# Patient Record
Sex: Female | Born: 1955 | Race: White | Hispanic: No | Marital: Married | Smoking: Former smoker
Health system: Southern US, Community
[De-identification: ages and names within clinical notes are randomized; demographics above are authoritative.]

## PROBLEM LIST (undated history)

## (undated) DIAGNOSIS — Z8489 Family history of other specified conditions: Secondary | ICD-10-CM

## (undated) DIAGNOSIS — B1920 Unspecified viral hepatitis C without hepatic coma: Secondary | ICD-10-CM

## (undated) DIAGNOSIS — Z923 Personal history of irradiation: Secondary | ICD-10-CM

## (undated) DIAGNOSIS — C50919 Malignant neoplasm of unspecified site of unspecified female breast: Secondary | ICD-10-CM

## (undated) DIAGNOSIS — R112 Nausea with vomiting, unspecified: Secondary | ICD-10-CM

## (undated) DIAGNOSIS — I10 Essential (primary) hypertension: Secondary | ICD-10-CM

## (undated) DIAGNOSIS — F988 Other specified behavioral and emotional disorders with onset usually occurring in childhood and adolescence: Secondary | ICD-10-CM

## (undated) DIAGNOSIS — Z9889 Other specified postprocedural states: Secondary | ICD-10-CM

## (undated) DIAGNOSIS — E785 Hyperlipidemia, unspecified: Secondary | ICD-10-CM

## (undated) DIAGNOSIS — C801 Malignant (primary) neoplasm, unspecified: Secondary | ICD-10-CM

## (undated) HISTORY — DX: Hyperlipidemia, unspecified: E78.5

## (undated) HISTORY — DX: Other specified behavioral and emotional disorders with onset usually occurring in childhood and adolescence: F98.8

## (undated) HISTORY — DX: Malignant (primary) neoplasm, unspecified: C80.1

## (undated) HISTORY — DX: Essential (primary) hypertension: I10

## (undated) HISTORY — PX: WISDOM TOOTH EXTRACTION: SHX21

## (undated) HISTORY — DX: Unspecified viral hepatitis C without hepatic coma: B19.20

## (undated) HISTORY — PX: MASTECTOMY: SHX3

## (undated) HISTORY — PX: BLADDER SUSPENSION: SHX72

---

## 1992-02-04 HISTORY — PX: LEEP: SHX91

## 1997-06-28 ENCOUNTER — Ambulatory Visit: Admission: RE | Admit: 1997-06-28 | Discharge: 1997-06-28 | Payer: Self-pay | Admitting: Otolaryngology

## 1998-01-17 ENCOUNTER — Ambulatory Visit (HOSPITAL_COMMUNITY): Admission: RE | Admit: 1998-01-17 | Discharge: 1998-01-17 | Payer: Self-pay | Admitting: Otolaryngology

## 1999-05-09 ENCOUNTER — Other Ambulatory Visit: Admission: RE | Admit: 1999-05-09 | Discharge: 1999-05-09 | Payer: Self-pay | Admitting: Obstetrics and Gynecology

## 1999-11-21 ENCOUNTER — Encounter: Payer: Self-pay | Admitting: Obstetrics and Gynecology

## 1999-11-21 ENCOUNTER — Encounter: Admission: RE | Admit: 1999-11-21 | Discharge: 1999-11-21 | Payer: Self-pay | Admitting: Obstetrics and Gynecology

## 2000-07-02 ENCOUNTER — Other Ambulatory Visit: Admission: RE | Admit: 2000-07-02 | Discharge: 2000-07-02 | Payer: Self-pay | Admitting: Obstetrics and Gynecology

## 2001-03-08 ENCOUNTER — Encounter: Payer: Self-pay | Admitting: Obstetrics and Gynecology

## 2001-03-08 ENCOUNTER — Encounter: Admission: RE | Admit: 2001-03-08 | Discharge: 2001-03-08 | Payer: Self-pay | Admitting: Obstetrics and Gynecology

## 2002-02-03 DIAGNOSIS — C50919 Malignant neoplasm of unspecified site of unspecified female breast: Secondary | ICD-10-CM

## 2002-02-03 DIAGNOSIS — C801 Malignant (primary) neoplasm, unspecified: Secondary | ICD-10-CM

## 2002-02-03 DIAGNOSIS — Z923 Personal history of irradiation: Secondary | ICD-10-CM

## 2002-02-03 HISTORY — PX: OVARIAN CYST REMOVAL: SHX89

## 2002-02-03 HISTORY — DX: Malignant neoplasm of unspecified site of unspecified female breast: C50.919

## 2002-02-03 HISTORY — PX: TUBAL LIGATION: SHX77

## 2002-02-03 HISTORY — PX: BREAST LUMPECTOMY: SHX2

## 2002-02-03 HISTORY — DX: Personal history of irradiation: Z92.3

## 2002-02-03 HISTORY — DX: Malignant (primary) neoplasm, unspecified: C80.1

## 2002-09-15 ENCOUNTER — Other Ambulatory Visit: Admission: RE | Admit: 2002-09-15 | Discharge: 2002-09-15 | Payer: Self-pay | Admitting: Obstetrics and Gynecology

## 2002-09-26 ENCOUNTER — Encounter (INDEPENDENT_AMBULATORY_CARE_PROVIDER_SITE_OTHER): Payer: Self-pay | Admitting: *Deleted

## 2002-09-26 ENCOUNTER — Encounter: Payer: Self-pay | Admitting: Obstetrics and Gynecology

## 2002-09-26 ENCOUNTER — Encounter: Admission: RE | Admit: 2002-09-26 | Discharge: 2002-09-26 | Payer: Self-pay | Admitting: Obstetrics and Gynecology

## 2002-10-11 ENCOUNTER — Ambulatory Visit (HOSPITAL_BASED_OUTPATIENT_CLINIC_OR_DEPARTMENT_OTHER): Admission: RE | Admit: 2002-10-11 | Discharge: 2002-10-11 | Payer: Self-pay | Admitting: General Surgery

## 2002-10-11 ENCOUNTER — Encounter: Payer: Self-pay | Admitting: General Surgery

## 2002-10-11 ENCOUNTER — Encounter: Admission: RE | Admit: 2002-10-11 | Discharge: 2002-10-11 | Payer: Self-pay | Admitting: General Surgery

## 2002-10-11 ENCOUNTER — Encounter (INDEPENDENT_AMBULATORY_CARE_PROVIDER_SITE_OTHER): Payer: Self-pay | Admitting: Specialist

## 2002-11-07 ENCOUNTER — Ambulatory Visit: Admission: RE | Admit: 2002-11-07 | Discharge: 2003-01-11 | Payer: Self-pay | Admitting: Radiation Oncology

## 2003-01-20 ENCOUNTER — Ambulatory Visit (HOSPITAL_COMMUNITY): Admission: RE | Admit: 2003-01-20 | Discharge: 2003-01-20 | Payer: Self-pay | Admitting: Obstetrics and Gynecology

## 2003-01-20 ENCOUNTER — Encounter (INDEPENDENT_AMBULATORY_CARE_PROVIDER_SITE_OTHER): Payer: Self-pay | Admitting: Specialist

## 2003-02-07 ENCOUNTER — Ambulatory Visit: Admission: RE | Admit: 2003-02-07 | Discharge: 2003-02-07 | Payer: Self-pay

## 2003-02-14 ENCOUNTER — Ambulatory Visit: Admission: RE | Admit: 2003-02-14 | Discharge: 2003-02-14 | Payer: Self-pay | Admitting: Radiation Oncology

## 2003-02-17 ENCOUNTER — Encounter (INDEPENDENT_AMBULATORY_CARE_PROVIDER_SITE_OTHER): Payer: Self-pay | Admitting: *Deleted

## 2003-02-17 ENCOUNTER — Ambulatory Visit (HOSPITAL_COMMUNITY): Admission: RE | Admit: 2003-02-17 | Discharge: 2003-02-17 | Payer: Self-pay | Admitting: Gastroenterology

## 2003-09-27 ENCOUNTER — Encounter: Admission: RE | Admit: 2003-09-27 | Discharge: 2003-09-27 | Payer: Self-pay | Admitting: Radiation Oncology

## 2003-11-16 ENCOUNTER — Other Ambulatory Visit: Admission: RE | Admit: 2003-11-16 | Discharge: 2003-11-16 | Payer: Self-pay | Admitting: Obstetrics and Gynecology

## 2004-11-07 ENCOUNTER — Encounter: Admission: RE | Admit: 2004-11-07 | Discharge: 2004-11-07 | Payer: Self-pay | Admitting: Obstetrics and Gynecology

## 2005-01-02 ENCOUNTER — Other Ambulatory Visit: Admission: RE | Admit: 2005-01-02 | Discharge: 2005-01-02 | Payer: Self-pay | Admitting: Obstetrics and Gynecology

## 2005-02-03 HISTORY — PX: AUGMENTATION MAMMAPLASTY: SUR837

## 2005-11-20 ENCOUNTER — Encounter: Admission: RE | Admit: 2005-11-20 | Discharge: 2005-11-20 | Payer: Self-pay | Admitting: Obstetrics and Gynecology

## 2006-11-26 ENCOUNTER — Encounter: Admission: RE | Admit: 2006-11-26 | Discharge: 2006-11-26 | Payer: Self-pay | Admitting: Obstetrics and Gynecology

## 2007-11-29 ENCOUNTER — Encounter: Admission: RE | Admit: 2007-11-29 | Discharge: 2007-11-29 | Payer: Self-pay | Admitting: Internal Medicine

## 2008-03-07 ENCOUNTER — Emergency Department (HOSPITAL_COMMUNITY): Admission: EM | Admit: 2008-03-07 | Discharge: 2008-03-07 | Payer: Self-pay | Admitting: Emergency Medicine

## 2008-09-19 ENCOUNTER — Other Ambulatory Visit: Admission: RE | Admit: 2008-09-19 | Discharge: 2008-09-19 | Payer: Self-pay | Admitting: Internal Medicine

## 2008-12-11 ENCOUNTER — Encounter: Admission: RE | Admit: 2008-12-11 | Discharge: 2008-12-11 | Payer: Self-pay | Admitting: Internal Medicine

## 2008-12-20 ENCOUNTER — Other Ambulatory Visit: Admission: RE | Admit: 2008-12-20 | Discharge: 2008-12-20 | Payer: Self-pay | Admitting: Internal Medicine

## 2009-05-07 LAB — HM COLONOSCOPY

## 2009-05-28 ENCOUNTER — Ambulatory Visit (HOSPITAL_COMMUNITY): Admission: RE | Admit: 2009-05-28 | Discharge: 2009-05-28 | Payer: Self-pay | Admitting: Obstetrics and Gynecology

## 2009-06-26 ENCOUNTER — Encounter: Payer: Self-pay | Admitting: Cardiology

## 2009-06-28 ENCOUNTER — Ambulatory Visit: Payer: Self-pay | Admitting: Cardiology

## 2009-06-28 DIAGNOSIS — I10 Essential (primary) hypertension: Secondary | ICD-10-CM

## 2009-07-03 ENCOUNTER — Ambulatory Visit (HOSPITAL_COMMUNITY): Admission: RE | Admit: 2009-07-03 | Discharge: 2009-07-03 | Payer: Self-pay | Admitting: Cardiology

## 2009-07-03 ENCOUNTER — Ambulatory Visit: Payer: Self-pay

## 2009-07-03 ENCOUNTER — Encounter: Payer: Self-pay | Admitting: Cardiology

## 2009-07-03 ENCOUNTER — Ambulatory Visit: Payer: Self-pay | Admitting: Cardiology

## 2010-02-24 ENCOUNTER — Encounter: Payer: Self-pay | Admitting: Obstetrics and Gynecology

## 2010-03-07 NOTE — Assessment & Plan Note (Signed)
Summary: np6/dx:fam hx of cva/htn/ekg changes/lg   Primary Provider:  Dr. Rachel Moulds  CC:  pt complains of high BP. Abnormal EKG.  History of Present Illness: 55 -year-old female with no prior cardiac history for evaluation of abnormal electrocardiogram and hypertension. She denies any history of dyspnea on exertion, orthopnea, PND, pedal edema, palpitations, syncope or chest pain. She has had surgical procedures recently. During those operations  she was noted to have elevated blood pressure on her normal antihypertensive regimen of toprol 50 mg po daily. She was seen by her primary care physician 2 days ago and Benicar 40 mg p.o. daily was added. She has also had some headaches. Her electrocardiogram was also felt to be abnormal and cardiology was therefore asked to further evaluate.  Current Medications (verified): 1)  Tylenol 325 Mg Tabs (Acetaminophen) .... As Needed 2)  Xanax 0.5 Mg Tabs (Alprazolam) .... As Needed 3)  Multivitamins   Tabs (Multiple Vitamin) .Marland Kitchen.. 1 Tab By Mouth Once Daily 4)  Fish Oil   Oil (Fish Oil) .Marland Kitchen.. 1 Tab By Mouth When Pt Rembers 5)  Metoprolol Succinate 50 Mg Xr24h-Tab (Metoprolol Succinate) .... Take One Tablet By Mouth Daily 6)  Benicar 40 Mg Tabs (Olmesartan Medoxomil) .Marland Kitchen.. 1 Tab By Mouth Once Daily 7)  Wellbutrin Xl 300 Mg Xr24h-Tab (Bupropion Hcl) .Marland Kitchen.. 1  Tab By Mouth Once Daily 8)  Probiotic  Caps (Probiotic Product) .Marland Kitchen.. 1 Tab By Mouth Once Daily  Past History:  Past Medical History: hypertension depression  Urinary incontinence, menorrhagia.  H/O breast cancer (s/p right lumpectomy and radiation)  Past Surgical History: Dilation and curettage, hysteroscopy, NovaSure endometrial  ablation, Solyx mid urethral sling, cystoscopy.  She had past surgery lumpectomy in 2004, breast augmentation in March 2008.   Family History: Reviewed history from 06/28/2009 and no changes required. Sister died of stomach cancer Father with CABG at age 78; died of  CVA at age 55 Mother died of brain tumor  Social History: Reviewed history from 06/28/2009 and no changes required. Former tobacco Occasional ETOH She is married  One son Family doctor is Dr. Rachel Moulds.      Review of Systems       Occasional headaches but no fevers or chills, productive cough, hemoptysis, dysphasia, odynophagia, melena, hematochezia, dysuria, hematuria, rash, seizure activity, orthopnea, PND, pedal edema, claudication. Remaining systems are negative.   Vital Signs:  Patient profile:   55 year old female Height:      62 inches Weight:      125 pounds BMI:     22.95 Pulse rate:   77 / minute Resp:     12 per minute BP sitting:   162 / 100  (left arm)  Vitals Entered By: Burnett Kanaris (Jun 28, 2009 3:09 PM)  Physical Exam  General:  Well-developed well-nourished in no acute distress.  Skin is warm and dry.  HEENT is normal. No papilledema Neck is supple. No thyromegaly.  Chest is clear to auscultation with normal expansion. Status post breast reconstruction surgery. Cardiovascular exam is regular rate and rhythm.  Abdominal exam nontender or distended. No masses palpated. Extremities show no edema. neuro grossly intact BP LUE 162/94 BP RUE 164/92    EKG  Procedure date:  06/26/2009  Findings:      Normal sinus rhythm at a rate of 63. Left axis deviation. No significant ST changes.  Impression & Recommendations:  Problem # 1:  HYPERTENSION, CONTROLLED (ICD-401.1) Her blood pressure is elevated but Benicar was added  only 2 days ago. She is not having headaches at present and there is no papilledema or examination. She would like a generic blood pressure medication. I will discontinue Benicar. Instead I will treat with lisinopril 40 mg p.o. daily and also add hydrochlorothiazide 12.5 mg p.o. daily. She will follow her blood pressure at home. If it remains elevated I will increase Toprol and potentially at Norvasc in the future. I will plan a  BMET in one week. If her blood pressure continues to be difficult to control we will begin workup for secondary causes including renal Dopplers. Note a TSH recently was normal. Her updated medication list for this problem includes:    Metoprolol Succinate 50 Mg Xr24h-tab (Metoprolol succinate) .Marland Kitchen... Take one tablet by mouth daily    Lisinopril 40 Mg Tabs (Lisinopril) ..... One tablet by mouth once daily    Hydrochlorothiazide 12.5 Mg Tabs (Hydrochlorothiazide) .Marland Kitchen... Take one tablet by mouth daily.  Orders: Echocardiogram (Echo)  Problem # 2:  ABNORMAL ELECTROCARDIOGRAM (ICD-794.31) I have reviewed her electrocardiogram and I do not think it is significantly abnormal. She is not having cardiac symptoms including no chest pain and no shortness of breath. No further workup at this time. Her updated medication list for this problem includes:    Metoprolol Succinate 50 Mg Xr24h-tab (Metoprolol succinate) .Marland Kitchen... Take one tablet by mouth daily    Lisinopril 40 Mg Tabs (Lisinopril) ..... One tablet by mouth once daily  Orders: Echocardiogram (Echo)  Patient Instructions: 1)  Your physician recommends that you schedule a follow-up appointment in: [place] 2)  Your physician recommends that you return for lab work in:ONE WEEK 3)  Your physician has recommended you make the following change in your medication: STOP BENICAR 4)  START LISINOPRIL 40MG ONCE DAILY 5)  START HCTZ 12.5MG ONCE DAILY 6)  Your physician has requested that you have an echocardiogram.  Echocardiography is a painless test that uses sound waves to create images of your heart. It provides your doctor with information about the size and shape of your heart and how well your heart's chambers and valves are working.  This procedure takes approximately one hour. There are no restrictions for this procedure. Prescriptions: HYDROCHLOROTHIAZIDE 12.5 MG TABS (HYDROCHLOROTHIAZIDE) Take one tablet by mouth daily.  #30 x 12   Entered by:    Fredia Beets, RN   Authorized by:   Colin Mulders, MD, [facility]   Signed by:   Fredia Beets, RN on 06/28/2009   Method used:   Electronically to        [id number] (retail)       [address]       [place], [place]  [address]       Ph: [phone] or [phone]       Fax: [phone]   RxID:   [id number] LISINOPRIL 40 MG TABS (LISINOPRIL) ONE TABLET by mouth once daily  #30 x 12   Entered by:   Fredia Beets, RN   Authorized by:   Colin Mulders, MD, [facility]   Signed by:   Fredia Beets, RN on 06/28/2009   Method used:   Electronically to        [id number] (retail)       [address]       [place], [place]  [address]       Ph: [phone] or  [phone]       Fax: [phone]   RxID:   [id number]

## 2010-03-07 NOTE — Progress Notes (Signed)
Summary: [facility] Adolescent Office Note  [facility] Adolescent Office Note   Imported By: Sallee Provencal 07/18/2009 16:32:30  _____________________________________________________________________  External Attachment:    Type:   Image     Comment:   External Document

## 2010-04-23 LAB — CBC
HCT: 40.8 % (ref 36.0–46.0)
Hemoglobin: 13.8 g/dL (ref 12.0–15.0)
MCHC: 33.9 g/dL (ref 30.0–36.0)
MCV: 91.3 fL (ref 78.0–100.0)
RBC: 4.47 MIL/uL (ref 3.87–5.11)

## 2010-04-23 LAB — COMPREHENSIVE METABOLIC PANEL
ALT: 44 U/L — ABNORMAL HIGH (ref 0–35)
CO2: 25 mEq/L (ref 19–32)
Calcium: 9.3 mg/dL (ref 8.4–10.5)
Creatinine, Ser: 0.71 mg/dL (ref 0.4–1.2)
GFR calc non Af Amer: >60 mL/min (ref >60)
Glucose, Bld: 97 mg/dL (ref 70–99)
Sodium: 136 mEq/L (ref 135–145)

## 2010-05-21 LAB — URINALYSIS, ROUTINE W REFLEX MICROSCOPIC
Glucose, UA: NEGATIVE mg/dL
Ketones, ur: NEGATIVE mg/dL
Specific Gravity, Urine: 1.006 (ref 1.005–1.030)
pH: 7 (ref 5.0–8.0)

## 2010-05-21 LAB — CBC
HCT: 42.4 % (ref 36.0–46.0)
Platelets: 300 10*3/uL (ref 150–400)
WBC: 5.5 10*3/uL (ref 4.0–10.5)

## 2010-05-21 LAB — POCT PREGNANCY, URINE: Preg Test, Ur: NEGATIVE

## 2010-05-21 LAB — DIFFERENTIAL
Eosinophils Relative: 0 % (ref 0–5)
Lymphocytes Relative: 14 % (ref 12–46)
Lymphs Abs: 0.8 10*3/uL (ref 0.7–4.0)
Neutro Abs: 4.4 10*3/uL (ref 1.7–7.7)

## 2010-05-21 LAB — BASIC METABOLIC PANEL
BUN: 10 mg/dL (ref 6–23)
GFR calc non Af Amer: >60 mL/min (ref >60)
Potassium: 4.5 mEq/L (ref 3.5–5.1)

## 2010-06-05 ENCOUNTER — Other Ambulatory Visit: Payer: Self-pay | Admitting: Obstetrics and Gynecology

## 2010-06-05 DIAGNOSIS — Z853 Personal history of malignant neoplasm of breast: Secondary | ICD-10-CM

## 2010-06-05 DIAGNOSIS — Z09 Encounter for follow-up examination after completed treatment for conditions other than malignant neoplasm: Secondary | ICD-10-CM

## 2010-06-21 NOTE — Op Note (Signed)
NAME:  [name], [name]                            ACCOUNT NO.:  [id number]   MEDICAL RECORD NO.:  [id number]                   PATIENT TYPE:  AMB   LOCATION:  ENDO                                 FACILITY:  [facility]   PHYSICIAN:  Nelwyn Salisbury, M.D.               DATE OF BIRTH:  October 18, 1955   DATE OF PROCEDURE:  02/17/2003  DATE OF DISCHARGE:                                 OPERATIVE REPORT   PROCEDURE PERFORMED:  Screening colonoscopy.   ENDOSCOPIST:  Nelwyn Salisbury, M.D.   INSTRUMENT USED:  Olympus video colonoscope.   INDICATIONS FOR PROCEDURE:  A 55 year old white female with a family history  of colon cancer in a  sister and personal history of breast cancer  undergoing a screening colonoscopy, rule out colonic polyps, masses, etc.   PREPROCEDURE PREPARATION:  Informed consent was procured from the patient.  The patient was fasted for 8 hours prior to the procedure and prepped with a  bottle of magnesium citrate and a gallon of GoLYTELY the night prior to the  procedure.   PREPROCEDURE PHYSICAL EXAMINATION:  VITAL SIGNS:  The patient with stable  vital signs.  NECK:  Supple.  CHEST:  Clear to auscultation.  CARDIAC:  S1, S2, regular.  ABDOMEN:  Soft with normal bowel sounds.   DESCRIPTION OF THE PROCEDURE:  The patient was placed in the left lateral  decubitus position, sedated with 70 mg of Demerol and 7 mg of Versed  intravenously.  Once the patient was adequately sedated and maintained on  low-flow oxygen and continuous cardiac monitoring, the Olympus video  colonoscope was advanced from the rectum to the cecum and the appendiceal  orifice and the ileocecal valve were clearly visualized and photographed.  No masses, polyps, erosions, ulcerations, or diverticula were seen.  Small  internal hemorrhoids were appreciated on retroflexion in the rectum.  The  patient tolerated the procedure well without complication.   IMPRESSION:  Essentially normal colonoscopy up to the  cecum except for small  internal hemorrhoids.   RECOMMENDATIONS:  1. Continue on high fiber diet with liberal food intake.  2. Repeat CRC screening in the next 5 years unless the patient develops any     abnormal symptoms in the interim.  3. Outpatient follow-up in the next 2 weeks if the patient is having any     residual symptoms.  Further recommendations will be made at that time.                                               Nelwyn Salisbury, M.D.    JNM/MEDQ  D:  02/17/2003  T:  02/18/2003  Job:  [id number]   cc:   [name] Dad. Magrinat, M.D.  [address]  [place]  [place] [address]  Fax: [place]. Matthew Saras, M.D.  [address], [place]  [place] [address]  Fax: [phone]

## 2010-06-21 NOTE — Op Note (Signed)
NAME:  [name], [name]                            ACCOUNT NO.:  [id number]   MEDICAL RECORD NO.:  [id number]                   PATIENT TYPE:  AMB   LOCATION:  SDC                                  FACILITY:  [facility]   PHYSICIAN:  Ralene Bathe. Matthew Saras, M.D.            DATE OF BIRTH:  [birth year]   DATE OF PROCEDURE:  01/20/2003  DATE OF DISCHARGE:                                 OPERATIVE REPORT   PREOPERATIVE DIAGNOSES:  1. Requests permanent sterilization and removal of intrauterine device.  2. Left lower quadrant pain.  3. History of left ovarian cyst.   POSTOPERATIVE DIAGNOSES:  1. Requests permanent sterilization and removal of intrauterine device.  2. Left lower quadrant pain.  3. History of left ovarian cyst.   PROCEDURES:  1. Diagnostic laparoscopy.  2. Laparoscopic tubal ligation by Filshie clip application.  3. Left ovarian cystotomy.  4. Removal of intrauterine device.   SURGEON:  Ralene Bathe. Matthew Saras, M.D.   ANESTHESIA:  General endotracheal.   COMPLICATIONS:  None.   DRAINS:  In-and-out Foley catheter.   ESTIMATED BLOOD LOSS:  Less than 5 mL.   PROCEDURE AND FINDINGS:  The patient taken to the operating room.  After an  adequate level of general endotracheal anesthesia was obtained with the  patient's legs in stirrups, the abdomen, perineum, and vagina were prepped  and draped in the usual manner for a laparoscopy.  The bladder was drained,  EUA carried out.  The uterus was midposition, normal size, mobile, adnexa  negative.  The IUD was removed.  The Hulka tenaculum was positioned.  Attention directed to the abdomen, where the subumbilical skin was  infiltrated with 0.5% Marcaine plain.  A small incision was made.  The  Veress needle was introduced without difficulty.  Its intra-abdominal  position was verified by pressure and water testing.  After a 2.5 L  pneumoperitoneum was then created, laparoscopic trocar introduced without  difficulty.  There was no evidence of  any bleeding or trauma.  Three  fingerbreadths above the symphysis in the midline, the skin was infiltrated  with 0.5% Marcaine plain and a 5 mm trocar was inserted under direct  visualization.  The patient then placed in Trendelenburg and the uterus  anteflexed, the pelvic findings as follows:   The anterior and posterior spaces were unremarkable.  The uterus itself was  normal size.  The right ovary and adnexal areas are normal.  The cul-de-sac  was free and clear.  The left ovary was elevated and noted to have a simple-  appearing smooth-walled cyst of approximately 3 cm.  No other cysts were  noted.  Marcaine 0.5% plain 3-4 mL were then dripped across the tube from  the cornu to the fimbriated end.  Filshie clip applicator was back-loaded.  Filshie clips applied at a right angle 2 cm from the cornu of the tube after  carefully identifying it, with excellent  application across the tube at a  right angle.  The exact same repeated on the opposite side.  This was  photographed with excellent application.  The left ovary was then elevated,  the surface of the cyst was coagulated with the bipolar, and the scissors  used to incise the cyst.  Straw-colored fluid was drained and sent for  cytology.  The cyst collapsed nicely.  The cyst wall was not stripped.  This  was hemostatic.  No other abnormalities were noted.  Prior to closure the  sponge, needle, and instrument counts were reported as correct x2.  Instruments were removed, gas allowed to escape, the defect closed with 4-0  Dexon subcuticular sutures and Dermabond.  She received IV Toradol and went  to recovery room in good condition.                                               Richard M. Matthew Saras, M.D.    RMH/MEDQ  D:  01/20/2003  T:  01/20/2003  Job:  [id number]

## 2010-06-21 NOTE — H&P (Signed)
NAME:  [name], [name]                            ACCOUNT NO.:  [id number]   MEDICAL RECORD NO.:  [id number]                   PATIENT TYPE:  AMB   LOCATION:  SDC                                  FACILITY:  [facility]   PHYSICIAN:  Ralene Bathe. Matthew Saras, M.D.            DATE OF BIRTH:  04-Apr-1955   DATE OF ADMISSION:  01/20/2003  DATE OF DISCHARGE:                                HISTORY & PHYSICAL   CHIEF COMPLAINT:  For tubal ligation, intrauterine device removal.   HISTORY OF PRESENT ILLNESS:  A 55 year old G1, P1. This patient had a Lenda Kelp  IUD inserted June 2004. Shortly thereafter on mammography, an early breast  cancer was detected. She had a biopsy September 2004 that showed ductal  carcinoma in situ and had a lumpectomy. She is planning RT at this time. Her  hematology/oncology doctor had recommended possible consideration for tubal  to eliminate any extra sources of hormone, in this case the progesterone in  the IUD. She now presents for removal of the IUD and tubal ligation. The  permanence of this procedure, failure rate of 2 to 3 per 1,000, other risks  relative to bleeding, infection, the possible need for open or additional  surgery all reviewed with her, which she understands and accepts. Also of  note, on her last ultrasound September 2004, she had a multiple small  functional appearing cyst on her left ovary 3.5 x 2.2, 2.0 x 1.2, 2.5 x 2.5,  and 1.9 x 1.5 cm. We discussed laparoscopic aspiration or cystectomy if  noted at the time of this surgery also.   ALLERGIES:  None.   PAST SURGICAL HISTORY:  LEEP 10 years ago.  Also a history of cryo in the  past for an abnormal pap.   OBSTETRIC HISTORY:  One vaginal delivery in 1990.   FAMILY HISTORY:  Significant for a sister with breast cancer. Father with  hypertension and heart disease.   LABORATORY DATA:  Last pap August 2004 was normal. CA-125 August 2004 was  13.5.   PHYSICAL EXAMINATION:  VITAL SIGNS:  Temperature 98.2,  blood pressure  120/68.  HEENT:  Unremarkable.  NECK:  Supple without mass.  LUNGS:  Clear.  CARDIOVASCULAR:  Regular rate and rhythm. Without murmur, rub, or gallop.  BREAST:  Without palpable masses.  ABDOMEN:  Soft, flat, nontender.  PELVIC:  Normal external genitalia. Vagina and cervix clear. Uterus normal  position, normal size. Adnexa negative. The IUD string was noted. No adnexal  masses or tenderness noted preoperative.  EXTREMITIES:  Examination unremarkable.  NEUROLOGIC:  Examination unremarkable.   IMPRESSION:  1. History of early breast cancer.  2. Presents now for tubal ligation and removal of intrauterine device,     possible left ovarian cystotomy.   Procedure and risks reviewed as above.  Richard M. Matthew Saras, M.D.    RMH/MEDQ  D:  01/19/2003  T:  01/19/2003  Job:  [id number]

## 2010-06-21 NOTE — Op Note (Signed)
   NAME:  [name], [name]                            ACCOUNT NO.:  [id number]   MEDICAL RECORD NO.:  [id number]                   PATIENT TYPE:  AMB   LOCATION:  [place]                                  FACILITY:  [facility]   PHYSICIAN:  Rudell Cobb. Young, M.D.                DATE OF BIRTH:  March 06, 1955   DATE OF PROCEDURE:  10/11/2002  DATE OF DISCHARGE:                                 OPERATIVE REPORT   PREOPERATIVE DIAGNOSIS:  Ductal carcinoma in situ of the right breast.   POSTOPERATIVE DIAGNOSIS:  Ductal carcinoma in situ of the right breast.   OPERATION/PROCEDURE:  Right partial mastectomy with needle localization and  specimen mammography.   SURGEON:  Rudell Cobb. Annamaria Boots, M.D.   ANESTHESIA:  General.   OPERATIVE PROCEDURE:  After suitable general anesthesia was induced, the  patient was placed in a supine position with the arms extended on the arm  board.  The right breast was prepped and draped in the usual fashion.   A curved incision centered at the 12 o'clock position of the right breast  and below the entering localization wire was then outlined.  The incision  was made and then a wide excision of the wire and surrounding tissue was  carried out.  Hemostasis was obtained with the cautery.  We infiltrated the  incision and the deeper tissue with 1% Xylocaine and 0.25% Marcaine.  The  incision was closed in a single layer with interrupted 4-0 Monocryl in a  subcuticular fashion and then Steri-Strips.  Dressing applied.   The specimen mammography confirmed the removal of the calcifications with  what appears to be a good margin.  She was then transferred to the recovery  room in satisfactory condition having tolerated the procedure well.                                                Rudell Cobb. Annamaria Boots, M.D.    PRY/MEDQ  D:  10/11/2002  T:  10/11/2002  Job:  [id number]

## 2010-07-01 ENCOUNTER — Other Ambulatory Visit: Payer: Self-pay | Admitting: Cardiology

## 2010-08-13 ENCOUNTER — Ambulatory Visit
Admission: RE | Admit: 2010-08-13 | Discharge: 2010-08-13 | Disposition: A | Discharge status: Home / Self Care | Payer: Commercial Managed Care - PPO | Source: Ambulatory Visit | Attending: Obstetrics and Gynecology | Admitting: Obstetrics and Gynecology

## 2010-08-13 DIAGNOSIS — Z853 Personal history of malignant neoplasm of breast: Secondary | ICD-10-CM

## 2010-10-15 ENCOUNTER — Ambulatory Visit: Admit: 2010-10-15 | Payer: Self-pay | Admitting: Obstetrics and Gynecology

## 2010-10-15 ENCOUNTER — Other Ambulatory Visit: Payer: Self-pay | Admitting: Obstetrics and Gynecology

## 2010-10-15 ENCOUNTER — Ambulatory Visit (HOSPITAL_BASED_OUTPATIENT_CLINIC_OR_DEPARTMENT_OTHER)
Admission: RE | Admit: 2010-10-15 | Discharge: 2010-10-15 | Disposition: A | Discharge status: Home / Self Care | Payer: Commercial Managed Care - PPO | Source: Ambulatory Visit | Attending: Obstetrics and Gynecology | Admitting: Obstetrics and Gynecology

## 2010-10-15 DIAGNOSIS — F411 Generalized anxiety disorder: Secondary | ICD-10-CM | POA: Insufficient documentation

## 2010-10-15 DIAGNOSIS — Z79899 Other long term (current) drug therapy: Secondary | ICD-10-CM | POA: Insufficient documentation

## 2010-10-15 DIAGNOSIS — Z01812 Encounter for preprocedural laboratory examination: Secondary | ICD-10-CM | POA: Insufficient documentation

## 2010-10-15 DIAGNOSIS — I10 Essential (primary) hypertension: Secondary | ICD-10-CM | POA: Insufficient documentation

## 2010-10-15 DIAGNOSIS — N949 Unspecified condition associated with female genital organs and menstrual cycle: Secondary | ICD-10-CM | POA: Insufficient documentation

## 2010-10-15 DIAGNOSIS — D279 Benign neoplasm of unspecified ovary: Secondary | ICD-10-CM | POA: Insufficient documentation

## 2010-10-15 DIAGNOSIS — N838 Other noninflammatory disorders of ovary, fallopian tube and broad ligament: Secondary | ICD-10-CM | POA: Insufficient documentation

## 2010-10-15 LAB — CBC
HCT: 33.6 % — ABNORMAL LOW (ref 36.0–46.0)
MCH: 32 pg (ref 26.0–34.0)
MCHC: 34.5 g/dL (ref 30.0–36.0)
MCV: 92.8 fL (ref 78.0–100.0)
Platelets: 252 10*3/uL (ref 150–400)
RDW: 12.1 % (ref 11.5–15.5)

## 2010-10-15 LAB — POCT PREGNANCY, URINE: Preg Test, Ur: NEGATIVE

## 2010-10-15 LAB — POCT I-STAT 4, (NA,K, GLUC, HGB,HCT)
Glucose, Bld: 81 mg/dL (ref 70–99)
Potassium: 4.1 mEq/L (ref 3.5–5.1)
Sodium: 138 mEq/L (ref 135–145)

## 2010-10-15 SURGERY — LAPAROSCOPY OPERATIVE
Anesthesia: General

## 2010-10-25 ENCOUNTER — Emergency Department (HOSPITAL_COMMUNITY): Payer: Commercial Managed Care - PPO

## 2010-10-25 ENCOUNTER — Emergency Department (HOSPITAL_COMMUNITY)
Admission: EM | Admit: 2010-10-25 | Discharge: 2010-10-25 | Disposition: A | Discharge status: Home / Self Care | Payer: Commercial Managed Care - PPO | Attending: Emergency Medicine | Admitting: Emergency Medicine

## 2010-10-25 DIAGNOSIS — R110 Nausea: Secondary | ICD-10-CM | POA: Insufficient documentation

## 2010-10-25 DIAGNOSIS — Z9889 Other specified postprocedural states: Secondary | ICD-10-CM | POA: Insufficient documentation

## 2010-10-25 DIAGNOSIS — Z853 Personal history of malignant neoplasm of breast: Secondary | ICD-10-CM | POA: Insufficient documentation

## 2010-10-25 DIAGNOSIS — I10 Essential (primary) hypertension: Secondary | ICD-10-CM | POA: Insufficient documentation

## 2010-10-25 DIAGNOSIS — R109 Unspecified abdominal pain: Secondary | ICD-10-CM | POA: Insufficient documentation

## 2010-10-25 LAB — BASIC METABOLIC PANEL
BUN: 12 mg/dL (ref 6–23)
Chloride: 98 mEq/L (ref 96–112)
Creatinine, Ser: 0.79 mg/dL (ref 0.50–1.10)
GFR calc Af Amer: >60 mL/min (ref >60)
GFR calc non Af Amer: >60 mL/min (ref >60)
Potassium: 3.9 mEq/L (ref 3.5–5.1)

## 2010-10-25 LAB — DIFFERENTIAL
Basophils Absolute: 0 10*3/uL (ref 0.0–0.1)
Eosinophils Absolute: 0.1 10*3/uL (ref 0.0–0.7)
Eosinophils Relative: 2 % (ref 0–5)
Monocytes Absolute: 0.5 10*3/uL (ref 0.1–1.0)

## 2010-10-25 LAB — CBC
MCHC: 34 g/dL (ref 30.0–36.0)
MCV: 92.8 fL (ref 78.0–100.0)
Platelets: 320 10*3/uL (ref 150–400)
RDW: 12.7 % (ref 11.5–15.5)
WBC: 5.8 10*3/uL (ref 4.0–10.5)

## 2010-10-25 LAB — URINALYSIS, ROUTINE W REFLEX MICROSCOPIC
Bilirubin Urine: NEGATIVE
Ketones, ur: NEGATIVE mg/dL
Leukocytes, UA: NEGATIVE
Nitrite: NEGATIVE
Urobilinogen, UA: 0.2 mg/dL (ref 0.0–1.0)
pH: 6 (ref 5.0–8.0)

## 2010-11-11 NOTE — H&P (Signed)
  [name], [name]                  ACCOUNT NO.:  [id number]  MEDICAL RECORD NO.:  [id number]  LOCATION:                                 FACILITY:  PHYSICIAN:  Ralene Bathe. [name], M.D.DATE OF BIRTH:  01-19-1956  DATE OF ADMISSION:  10/15/2010 DATE OF DISCHARGE:                             HISTORY & PHYSICAL   This should go to the [name].  DATE OF SURGERY:  10/15/2010.  CHIEF COMPLAINT:  Chronic left lower quadrant pain, recurrent left ovarian cyst.  HISTORY OF PRESENT ILLNESS:  55 year old G1, P1, prior tubal.  This patient in 2011 had a mid urethral sling and NovaSure endometrial ablation and since that time has developed recurrent left lower quadrant pain.  Ultrasound exams in the office has shown a functional-appearing 2- 4 cm cyst on various occasions.  She was scheduled at one point for laparoscopic LSO.  The pain got better, but has recurred recently requiring narcotics for pain relief.  She would prefer to be conservative and maintain her right ovary if otherwise normal.  The procedure of laparoscopic LSO including risks related to bleeding, infection, transfusion, and the possible need for open additional surgery discussed with her, which she understands and accepts.  PAST MEDICAL HISTORY:  None.  ALLERGIES:  None.  CURRENT MEDICATIONS:  Wellbutrin, Toprol, and Percocet p.r.n.  REVIEW OF SYSTEMS:  Significant for hypertension.  PAST SURGICAL HISTORY:  She had a lumpectomy in 2000, breast augmentation in 2008, and mid urethral sling with NovaSure endometrial ablation.  FAMILY HISTORY:  Otherwise unremarkable.  SOCIAL HISTORY:  Denies tobacco use.  One drink per day.  She is married.  Her family doctor is Dr. Rachel Moulds.  PHYSICAL EXAMINATION:  VITAL SIGNS:  Temperature 98.2 and blood pressure 152/96. HEENT:  Unremarkable. NECK:  Supple without masses. LUNGS:  Clear. CARDIOVASCULAR:  Regular rate and rhythm  without murmurs, rubs, or gallops. BREASTS:  Without masses. ABDOMEN:  Soft, flat, and nontender. PELVIC EXAM:  Normal external genitalia.  Vaginal discharge is clear. Last Pap 05/11, which was negative.  Uterus midposition, normal size, slightly tender on the left.  No definite mass. EXTREMITIES:  Unremarkable. NEUROLOGIC EXAM:  Unremarkable.  Last ultrasound 07/12, demonstrated two simple cysts on the left, 3.7 x 4.2 and 1.9 x 1.5.  No free fluid, right side negative.  IMPRESSION:  Chronic left lower quadrant pain, recurrent left ovarian cyst.  PLAN:  Laparoscopic LSO.  Procedure and risks reviewed as above.     [name] M. [name], M.D.     RMH/MEDQ  D:  10/10/2010  T:  10/10/2010  Job:  [id number]  Electronically Signed by Molli Posey M.D. on 11/11/2010 08:59:23 AM

## 2010-11-11 NOTE — Op Note (Signed)
Barnett, Kathryn                 ACCOUNT NO.:  [id number]  MEDICAL RECORD NO.:  [id number]  LOCATION:                                 FACILITY:  PHYSICIAN:  Ralene Bathe. Matthew Saras, M.D.    DATE OF BIRTH:  DATE OF PROCEDURE: DATE OF DISCHARGE:                              OPERATIVE REPORT   PREOPERATIVE DIAGNOSES: 1. Recurrent left ovarian cyst. 2. Chronic pelvic pain.  POSTOPERATIVE DIAGNOSES: 1. Recurrent left ovarian cyst. 2. Chronic pelvic pain.  PROCEDURE:  Diagnostic laparoscopy followed by minilaparotomy with left oophorectomy, lysis of adhesions.  SURGEON:  Ralene Bathe. Matthew Saras, M.D.  ANESTHESIA:  General endotracheal.  COMPLICATIONS:  None.  DRAINS:  Foley catheter.  BLOOD LOSS:  Less than 50 mL.  SPECIMENS REMOVED:  Left ovary to Pathology.  COMPLICATIONS:  None.  PROCEDURE AND FINDINGS:  The patient was taken to the operating room. After an adequate level of general endotracheal anesthesia was obtained with the patient's legs in stirrups, the abdomen, perineum and vagina were prepped and draped in usual manner for laparoscopy.  A Kahn cannula was positioned on the uterus which was mid positioned, normal size. This was done after the bladder was drained with an in-and-out Foley catheter.  Attention directed to the umbilicus.  The subumbilical area was infiltrated with 0.25% Marcaine plain.  A small incision was made and the Veress needle was introduced without difficulty.  Its intra- abdominal position was verified by pressure and water testing.  After a 2-1/2 liter pneumoperitoneum was then created, laparoscopic trocar and sleeve were then introduced without difficulty.  There was no evidence of any bleeding or trauma.  Two fingerbreadths above the symphysis of the midline, a 5-mm trocar was inserted under direct visualization. Pelvic findings as follows.  The uterus itself was normal size, mobile.  Posterior cul-de-sac free and clear.  She had had a  clipped tubal previously.  The right ovary appeared to be otherwise normal.  On the left side, the ovary was enlarged approximately 4 cm with a smooth-walled cyst; was mobile, but there were some epiploical adhesions into the area of the left tube and ovary from her prior tubal on that side.  I could not get the ovary to elevate completely due to the size of the ovary.  Decision made to proceed with minilaparotomy.  The scope was removed.  The upper gas allowed to escape the upper incision closed with a 4-0 Vicryl subcuticular suture.  A small Pfannenstiel minilaparotomy incision was made, carried down individually through fascia and peritoneum.  The uterine instruments were used to elevate the uterus into the incision. Babcock clamp was then used to grasp the left round ligament and the left ovary was elevated into the incision.  The __________ epiploical adhesions were lysed with sharp dissection.  Once the ovary was elevated, the left __________ ligament was clamped, divided and suture ligated in sequential manner with 0-Vicryl suture, removing the left ovary.  The operative site was irrigated and noted to be hemostatic. Prior to closure, sponge, needle, instrument counts reported as correct x2.  Peritoneum closed with a running 2-0 Vicryl suture.  Rectus muscle was closed with interrupted 2-0 Vicryl  sutures.  Fascia closed transversely with a 0 Vicryl suture.  The subcutaneous tissue was minimal and was hemostatic.  4-0 Monocryl subcuticular suture on the skin.  This was infiltrated with 0.25% Marcaine plain on the radialis. She tolerated this well, went to recovery room in good condition.  Foley catheter was positioned prior to taking her to PACU.     [name], M.D.     [facility]/MEDQ  D:  10/15/2010  T:  10/15/2010  Job:  [id number]  Electronically Signed by Molli Posey M.D. on 11/11/2010 08:58:10 AM

## 2012-01-14 ENCOUNTER — Other Ambulatory Visit: Payer: Self-pay | Admitting: Obstetrics and Gynecology

## 2012-01-14 DIAGNOSIS — Z1231 Encounter for screening mammogram for malignant neoplasm of breast: Secondary | ICD-10-CM

## 2012-01-14 DIAGNOSIS — Z853 Personal history of malignant neoplasm of breast: Secondary | ICD-10-CM

## 2012-02-19 ENCOUNTER — Ambulatory Visit
Admission: RE | Admit: 2012-02-19 | Discharge: 2012-02-19 | Disposition: A | Discharge status: Home / Self Care | Payer: Commercial Managed Care - PPO | Source: Ambulatory Visit | Attending: Obstetrics and Gynecology | Admitting: Obstetrics and Gynecology

## 2012-02-19 DIAGNOSIS — Z853 Personal history of malignant neoplasm of breast: Secondary | ICD-10-CM

## 2012-02-19 DIAGNOSIS — Z1231 Encounter for screening mammogram for malignant neoplasm of breast: Secondary | ICD-10-CM

## 2012-09-03 DIAGNOSIS — B1920 Unspecified viral hepatitis C without hepatic coma: Secondary | ICD-10-CM

## 2012-09-03 HISTORY — DX: Unspecified viral hepatitis C without hepatic coma: B19.20

## 2012-09-28 ENCOUNTER — Other Ambulatory Visit: Payer: Self-pay

## 2012-09-28 ENCOUNTER — Other Ambulatory Visit (HOSPITAL_COMMUNITY)
Admission: RE | Admit: 2012-09-28 | Discharge: 2012-09-28 | Disposition: A | Discharge status: Home / Self Care | Payer: 59 | Source: Ambulatory Visit | Attending: Internal Medicine | Admitting: Internal Medicine

## 2012-09-28 DIAGNOSIS — Z01419 Encounter for gynecological examination (general) (routine) without abnormal findings: Secondary | ICD-10-CM | POA: Insufficient documentation

## 2012-12-21 ENCOUNTER — Encounter: Payer: Self-pay | Admitting: Physician Assistant

## 2012-12-27 ENCOUNTER — Encounter: Payer: Self-pay | Admitting: Internal Medicine

## 2012-12-27 DIAGNOSIS — I10 Essential (primary) hypertension: Secondary | ICD-10-CM | POA: Insufficient documentation

## 2012-12-27 DIAGNOSIS — E785 Hyperlipidemia, unspecified: Secondary | ICD-10-CM | POA: Insufficient documentation

## 2012-12-27 DIAGNOSIS — Z853 Personal history of malignant neoplasm of breast: Secondary | ICD-10-CM | POA: Insufficient documentation

## 2012-12-29 ENCOUNTER — Encounter: Payer: Self-pay | Admitting: Physician Assistant

## 2012-12-29 ENCOUNTER — Ambulatory Visit: Payer: PRIVATE HEALTH INSURANCE | Admitting: Physician Assistant

## 2012-12-29 VITALS — BP 98/60 | HR 96 | Temp 98.1°F | Resp 16 | Ht 62.0 in | Wt 119.0 lb

## 2012-12-29 DIAGNOSIS — L089 Local infection of the skin and subcutaneous tissue, unspecified: Secondary | ICD-10-CM

## 2012-12-29 DIAGNOSIS — F988 Other specified behavioral and emotional disorders with onset usually occurring in childhood and adolescence: Secondary | ICD-10-CM

## 2012-12-29 MED ORDER — AMPHETAMINE-DEXTROAMPHETAMINE 10 MG PO TABS: 10.0000 mg | ORAL_TABLET | Freq: Two times a day (BID) | ORAL | Status: DC

## 2012-12-29 MED ORDER — SULFAMETHOXAZOLE-TRIMETHOPRIM 400-80 MG PO TABS: 1.0000 | ORAL_TABLET | Freq: Two times a day (BID) | ORAL | Status: AC

## 2012-12-29 MED ORDER — ALPRAZOLAM 0.5 MG PO TABS: 0.5000 mg | ORAL_TABLET | Freq: Two times a day (BID) | ORAL | Status: DC | PRN

## 2012-12-29 NOTE — Progress Notes (Signed)
HPI Patient presents for a one month follow up. Patient is here for Seb cyst that is now infected.   She also complains of trouble focusing at work and when she was younger in school. Son also diagnosed with it.   Past Medical History  Diagnosis Date  . Hyperlipidemia   . Hypertension   . Cancer 2004    Breast      Allergies no known allergies    Current Outpatient Prescriptions on File Prior to Visit  Medication Sig Dispense Refill  . ALPRAZolam (XANAX) 0.5 MG tablet Take 0.5 mg by mouth 2 (two) times daily as needed for anxiety.      Marland Kitchen buPROPion (WELLBUTRIN XL) 300 MG 24 hr tablet Take 300 mg by mouth daily.      Marland Kitchen lisinopril-hydrochlorothiazide (PRINZIDE,ZESTORETIC) 20-25 MG per tablet Take 1 tablet by mouth daily.       No current facility-administered medications on file prior to visit.    ROS: all negative expect above.   Physical: Filed Weights   12/29/12 1554  Weight: 119 lb (53.978 kg)   Filed Vitals:   12/29/12 1554  BP: 98/60  Pulse: 96  Temp: 98.1 F (36.7 C)  Resp: 16   General Appearance: Well nourished, in no apparent distress. Eyes: PERRLA, EOMs. Sinuses: No Frontal/maxillary tenderness ENT/Mouth: Ext aud canals clear, normal light reflex with TMs without erythema, bulging. Post pharynx without erythema, swelling, exudate.  Respiratory: CTAB Cardio: RRR, no murmurs, rubs or gallops. Peripheral pulses brisk and equal bilaterally, without edema. No aortic or femoral bruits. Abdomen: Flat, soft, with bowl sounds. Nontender, no guarding, rebound. Lymphatics: Non tender without lymphadenopathy.  Musculoskeletal: Full ROM all peripheral extremities, 5/5 strength, and normal gait. Skin: Area on left back erythematous, 2x3 inch area of erythema, fluctuant. Area cleaned and 11 blade used to back an insicion, area packed and patient tolerated well.  Neuro: Cranial nerves intact, reflexes equal bilaterally. Normal muscle tone, no cerebellar symptoms. Sensation  intact.  Pysch: Awake and oriented X 3, normal affect, Insight and Judgment appropriate.   Assessment and Plan:  Seb Cyst- infected bactrim 500 # 20 ADD- Adderall 20 BID #60 NR Anxiety refill Xanax  Follow up in one month

## 2013-01-04 ENCOUNTER — Encounter: Payer: Self-pay | Admitting: Physician Assistant

## 2013-01-25 ENCOUNTER — Other Ambulatory Visit: Payer: Self-pay | Admitting: Physician Assistant

## 2013-01-25 MED ORDER — AMPHETAMINE-DEXTROAMPHETAMINE 10 MG PO TABS: 10.0000 mg | ORAL_TABLET | Freq: Two times a day (BID) | ORAL | Status: DC

## 2013-02-08 ENCOUNTER — Other Ambulatory Visit: Payer: Self-pay | Admitting: Physician Assistant

## 2013-02-08 ENCOUNTER — Ambulatory Visit (INDEPENDENT_AMBULATORY_CARE_PROVIDER_SITE_OTHER): Payer: 59 | Admitting: Physician Assistant

## 2013-02-08 ENCOUNTER — Encounter: Payer: Self-pay | Admitting: Physician Assistant

## 2013-02-08 VITALS — BP 118/78 | HR 80 | Temp 98.1°F | Resp 16 | Ht 62.0 in | Wt 115.0 lb

## 2013-02-08 DIAGNOSIS — E785 Hyperlipidemia, unspecified: Secondary | ICD-10-CM

## 2013-02-08 DIAGNOSIS — R768 Other specified abnormal immunological findings in serum: Secondary | ICD-10-CM

## 2013-02-08 DIAGNOSIS — I10 Essential (primary) hypertension: Secondary | ICD-10-CM

## 2013-02-08 DIAGNOSIS — Z1159 Encounter for screening for other viral diseases: Secondary | ICD-10-CM

## 2013-02-08 DIAGNOSIS — E559 Vitamin D deficiency, unspecified: Secondary | ICD-10-CM

## 2013-02-08 DIAGNOSIS — Z118 Encounter for screening for other infectious and parasitic diseases: Secondary | ICD-10-CM

## 2013-02-08 DIAGNOSIS — Z79899 Other long term (current) drug therapy: Secondary | ICD-10-CM

## 2013-02-08 LAB — CBC WITH DIFFERENTIAL/PLATELET
BASOS ABS: 0 10*3/uL (ref 0.0–0.1)
Basophils Relative: 0 % (ref 0–1)
EOS PCT: 2 % (ref 0–5)
Eosinophils Absolute: 0.1 10*3/uL (ref 0.0–0.7)
HCT: 42 % (ref 36.0–46.0)
Hemoglobin: 14.8 g/dL (ref 12.0–15.0)
LYMPHS ABS: 1.1 10*3/uL (ref 0.7–4.0)
LYMPHS PCT: 18 % (ref 12–46)
MCH: 31.1 pg (ref 26.0–34.0)
MCHC: 35.2 g/dL (ref 30.0–36.0)
MCV: 88.2 fL (ref 78.0–100.0)
Monocytes Absolute: 0.6 10*3/uL (ref 0.1–1.0)
Monocytes Relative: 11 % (ref 3–12)
NEUTROS ABS: 4 10*3/uL (ref 1.7–7.7)
Neutrophils Relative %: 69 % (ref 43–77)
PLATELETS: 274 10*3/uL (ref 150–400)
RBC: 4.76 MIL/uL (ref 3.87–5.11)
RDW: 13.1 % (ref 11.5–15.5)
WBC: 5.8 10*3/uL (ref 4.0–10.5)

## 2013-02-08 LAB — HEPATIC FUNCTION PANEL
ALBUMIN: 5 g/dL (ref 3.5–5.2)
ALK PHOS: 107 U/L (ref 39–117)
ALT: 43 U/L — ABNORMAL HIGH (ref 0–35)
AST: 43 U/L — AB (ref 0–37)
BILIRUBIN DIRECT: 0.1 mg/dL (ref 0.0–0.3)
BILIRUBIN INDIRECT: 0.6 mg/dL (ref 0.0–0.9)
Total Bilirubin: 0.7 mg/dL (ref 0.3–1.2)
Total Protein: 7.7 g/dL (ref 6.0–8.3)

## 2013-02-08 LAB — BASIC METABOLIC PANEL WITH GFR
BUN: 15 mg/dL (ref 6–23)
CO2: 29 meq/L (ref 19–32)
CREATININE: 0.8 mg/dL (ref 0.50–1.10)
Calcium: 10.1 mg/dL (ref 8.4–10.5)
Chloride: 97 mEq/L (ref 96–112)
GFR, EST NON AFRICAN AMERICAN: 83 mL/min
GFR, Est African American: >89 mL/min
Glucose, Bld: 95 mg/dL (ref 70–99)
POTASSIUM: 4.8 meq/L (ref 3.5–5.3)
SODIUM: 134 meq/L — AB (ref 135–145)

## 2013-02-08 LAB — MAGNESIUM: MAGNESIUM: 2.3 mg/dL (ref 1.5–2.5)

## 2013-02-08 LAB — LIPID PANEL
Cholesterol: 260 mg/dL — ABNORMAL HIGH (ref 0–200)
HDL: 130 mg/dL (ref >39)
LDL CALC: 120 mg/dL — AB (ref 0–99)
Total CHOL/HDL Ratio: 2 Ratio
Triglycerides: 50 mg/dL (ref <150)
VLDL: 10 mg/dL (ref 0–40)

## 2013-02-08 MED ORDER — AMPHETAMINE-DEXTROAMPHETAMINE 15 MG PO TABS: 15.0000 mg | ORAL_TABLET | Freq: Two times a day (BID) | ORAL | Status: DC

## 2013-02-08 NOTE — Progress Notes (Signed)
HPI Patient presents for 3 month follow up with hypertension, hyperlipidemia, and vitamin D. Patient's blood pressure has been controlled at home, today their BP is BP: 118/78 mmHg  Patient denies chest pain, shortness of breath, dizziness.  Patient was here 12/29/2012 for infected seb cyst and ADD. She was given bactrim and put on Adderall 29m. She states that the cyst has healed and the Adderall has helped her focus. She has at times taken 1.5 at one time and she takes it twice a day which she feels works for her. She just got the adderall 01/25/13.  Patient's cholesterol is diet controlled. The cholesterol last visit was LDL 108 (119). At her physical her LFTs were elevated at 43 and 47, a hepatitis panel was + for genotype 1a Hep C. She has not been to the hepatitis clinic and will need a referral today. 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 States she has had hepatitis B series.  Patient is on Vitamin D supplement.   Current Medications:  Current Outpatient Prescriptions on File Prior to Visit  Medication Sig Dispense Refill  . ALPRAZolam (XANAX) 0.5 MG tablet Take 1 tablet (0.5 mg total) by mouth 2 (two) times daily as needed for anxiety.  60 tablet  1  . amphetamine-dextroamphetamine (ADDERALL) 10 MG tablet Take 1 tablet (10 mg total) by mouth 2 (two) times daily with a meal.  60 tablet  0  . buPROPion (WELLBUTRIN XL) 300 MG 24 hr tablet Take 300 mg by mouth daily.      .Marland Kitchenlisinopril-hydrochlorothiazide (PRINZIDE,ZESTORETIC) 20-25 MG per tablet Take 1 tablet by mouth daily.       No current facility-administered medications on file prior to visit.   Medical History:  Past Medical History  Diagnosis Date  . Hyperlipidemia   . Hypertension   . ADD (attention deficit disorder)   . Cancer 2004    Breast   . Hepatitis C 09/2012    Genotype 1a, sent to [facility]   Allergies: No  Known Allergies  ROS Constitutional: Denies fever, chills, headaches, insomnia, fatigue, night sweats Eyes: Denies redness, blurred vision, diplopia, discharge, itchy, watery eyes.  ENT: Denies congestion, post nasal drip, sore throat, earache, dental pain, Tinnitus, Vertigo, Sinus pain, snoring.  Cardio: Denies chest pain, palpitations, irregular heartbeat, dyspnea, diaphoresis, orthopnea, PND, claudication, edema Respiratory: denies cough, shortness of breath, wheezing.  Gastrointestinal: Denies dysphagia, heartburn, AB pain/ cramps, N/V, diarrhea, constipation, hematemesis, melena, hematochezia,  hemorrhoids Genitourinary: Denies dysuria, frequency, urgency, nocturia, hesitancy, discharge, hematuria, flank pain Musculoskeletal: Denies myalgia, stiffness, pain, swelling and strain/sprain. Skin: Denies pruritis, rash, changing in skin lesion Neuro: Denies Weakness, tremor, incoordination, spasms, pain Psychiatric: Denies confusion, memory loss, sensory loss Endocrine: Denies change in weight, skin, hair change, nocturia Diabetic Polys, Denies visual blurring, hyper /hypo glycemic episodes, and paresthesia, Heme/Lymph: Denies Excessive bleeding, bruising, enlarged lymph nodes  Family history- Review and unchanged Social history- Review and unchanged Physical Exam: Filed Vitals:   02/08/13 0943  BP: 118/78  Pulse: 80  Temp: 98.1 F (36.7 C)  Resp: 16   Filed Weights   02/08/13 0943  Weight: 115 lb (52.164 kg)   General Appearance: Well nourished, in no apparent distress. Eyes: PERRLA, EOMs, conjunctiva no swelling or erythema Sinuses: No Frontal/maxillary tenderness ENT/Mouth: Ext aud canals clear, TMs without erythema, bulging. No erythema, swelling, or exudate on post pharynx.  Tonsils not swollen or  erythematous. Hearing normal.  Neck: Supple, thyroid normal.  Respiratory: Respiratory effort normal, BS equal bilaterally without rales, rhonchi, wheezing or stridor.  Cardio: RRR  with no MRGs. Brisk peripheral pulses without edema.  Abdomen: Soft, + BS.  Non tender, no guarding, rebound, hernias, masses. Lymphatics: Non tender without lymphadenopathy.  Musculoskeletal: Full ROM, 5/5 strength, normal gait.  Skin: Warm, dry without rashes, lesions, ecchymosis.  Neuro: Cranial nerves intact. Normal muscle tone, no cerebellar symptoms. Sensation intact.  Psych: Awake and oriented X 3, normal affect, Insight and Judgment appropriate.   Assessment and Plan:  Hypertension: Continue medication, monitor blood pressure at home.  Continue DASH diet. Cholesterol: Continue diet and exercise. Check cholesterol.  Vitamin D Def- check level and continue medications.  Vaginal atrophy- suggest discussing with OBGYN since she has a history of breast cancer.  Hepatitis C- check LFTs today, avoid tylenol. Alcohol and refer to hepatitis clinic. ADD- written post dated Adderall 13m #60 NR  Continue diet and meds as discussed. Further disposition pending results of labs.  CVicie Mutters9:51 AM

## 2013-02-08 NOTE — Patient Instructions (Signed)
VAGINAL DRYNESS OVERVIEW  Vaginal dryness, also known as atrophic vaginitis, is a common condition in postmenopausal women. This condition is also common in women who have had both ovaries removed at the time of hysterectomy.   Some women have uncomfortable symptoms of vaginal dryness, such as pain with sex, burning vaginal discomfort or itching, or abnormal vaginal discharge, while others have no symptoms at all.  VAGINAL DRYNESS CAUSES   Estrogen helps to keep the vagina moist and to maintain thickness of the vaginal lining. Vaginal dryness occurs when the ovaries produce a decreased amount of estrogen. This can occur at certain times in a woman's life, and may be permanent or temporary. Times when less estrogen is made include: ?At the time of menopause. ?After surgical removal of the ovaries, chemotherapy, or radiation therapy of the pelvis for cancer. ?After having a baby, particularly in women who breastfeed. ?While using certain medications, such as danazol, medroxyprogesterone (brand names: Provera or DepoProvera), leuprolide (brand name: Lupron), or nafarelin. When these medications are stopped, estrogen production resumes.  Women who smoke cigarettes have been shown to have an increased risk of an earlier menopause transition as compared to non-smokers. Therefore, atrophic vaginitis symptoms may appear at a younger age in this population.  VAGINAL DRYNESS TREATMENT   There are three treatment options for women with vaginal dryness:  Vaginal lubricants and moisturizers - Vaginal lubricants and moisturizers can be purchased without a prescription. These products do not contain any hormones and have virtually no side effects. - Albolene is found in the facial cleanser section at [facility], [facility], or [employer]. It is a large jar with a blue top. This is the best lubricant for women because it is hypoallergenic. -Natural lubricants, such as olive, avocado or peanut oil, are easily available  products that may be used as a lubricant with sex.  -Vaginal moisturizes (eg, Replens, Moist Again, Vagisil, K-Y Silk-E, and Feminease) are formulated to allow water to be retained in the vaginal tissues. Moisturizers are applied into the vagina three times weekly to allow a continued moisturizing effect. These should not be used just before having sex, as they can be irritating.  Vaginal estrogen - Vaginal estrogen is the most effective treatment option for women with vaginal dryness. Vaginal estrogen must be prescribed by a healthcare provider. Very low doses of vaginal estrogen can be used when it is put into the vagina to treat vaginal dryness. A small amount of estrogen is absorbed into the bloodstream, but only about 100 times less than when using estrogen pills or tablets. As a result, there is a much lower risk of side effects, such as blood clots, breast cancer, and heart attack, compared with other estrogen-containing products (birth control pills, menopausal hormone therapy).   Ospemifene - Ospemifene is a prescription medication that is similar to estrogen, but is not estrogen. In the vaginal tissue, it acts similarly to estrogen. In the breast tissue, it acts as an estrogen blocker. It comes in a pill, and is prescribed for women who want to use an estrogen-like medication for vaginal dryness or painful sex associated with vaginal dryness, but prefer not to use a vaginal medication. The medication may cause hot flashes as a side effect. This type of medication may increase the risk of blood clots or uterine cancer. Further study of ospemifene is needed to evaluate the risk of these complications. This medication has not been tested in women who have had breast cancer or are at a high risk of developing  breast cancer.    Sexual activity - Vaginal estrogen improves vaginal dryness quickly, usually within a few weeks. You may continue to have sex as you treat vaginal dryness because sex itself  can help to keep the vaginal tissues healthy. Vaginal intercourse may help the vaginal tissues by keeping them soft and stretchable and preventing the tissues from shrinking.  If sex continues to be painful despite treatment for vaginal dryness, talk to your healthcare provider.

## 2013-02-09 LAB — RPR

## 2013-02-09 LAB — HIV ANTIBODY (ROUTINE TESTING W REFLEX): HIV: NONREACTIVE

## 2013-02-09 LAB — TSH: TSH: 1.853 u[IU]/mL (ref 0.350–4.500)

## 2013-02-09 LAB — VITAMIN D 25 HYDROXY (VIT D DEFICIENCY, FRACTURES): VIT D 25 HYDROXY: 92 ng/mL — AB (ref 30–89)

## 2013-02-22 ENCOUNTER — Other Ambulatory Visit: Payer: Self-pay

## 2013-02-22 DIAGNOSIS — B1920 Unspecified viral hepatitis C without hepatic coma: Secondary | ICD-10-CM

## 2013-02-22 DIAGNOSIS — R768 Other specified abnormal immunological findings in serum: Secondary | ICD-10-CM

## 2013-03-01 ENCOUNTER — Encounter: Payer: Self-pay | Admitting: Physician Assistant

## 2013-03-16 ENCOUNTER — Other Ambulatory Visit: Payer: Self-pay | Admitting: Physician Assistant

## 2013-03-16 ENCOUNTER — Encounter: Payer: Self-pay | Admitting: Physician Assistant

## 2013-03-16 MED ORDER — AMPHETAMINE-DEXTROAMPHETAMINE 15 MG PO TABS: 15.0000 mg | ORAL_TABLET | Freq: Two times a day (BID) | ORAL | Status: DC

## 2013-03-28 ENCOUNTER — Other Ambulatory Visit: Payer: Self-pay | Admitting: Physician Assistant

## 2013-04-22 ENCOUNTER — Other Ambulatory Visit: Payer: Self-pay | Admitting: Physician Assistant

## 2013-04-22 MED ORDER — AMPHETAMINE-DEXTROAMPHETAMINE 15 MG PO TABS: 15.0000 mg | ORAL_TABLET | Freq: Two times a day (BID) | ORAL | Status: DC

## 2013-05-16 ENCOUNTER — Other Ambulatory Visit: Payer: Self-pay | Admitting: Internal Medicine

## 2013-05-26 ENCOUNTER — Other Ambulatory Visit: Payer: Self-pay | Admitting: Emergency Medicine

## 2013-05-31 ENCOUNTER — Ambulatory Visit (INDEPENDENT_AMBULATORY_CARE_PROVIDER_SITE_OTHER): Payer: PRIVATE HEALTH INSURANCE | Admitting: Physician Assistant

## 2013-05-31 ENCOUNTER — Encounter: Payer: Self-pay | Admitting: Physician Assistant

## 2013-05-31 VITALS — BP 138/80 | HR 100 | Temp 98.1°F | Resp 16 | Wt 116.0 lb

## 2013-05-31 DIAGNOSIS — E559 Vitamin D deficiency, unspecified: Secondary | ICD-10-CM

## 2013-05-31 DIAGNOSIS — Z79899 Other long term (current) drug therapy: Secondary | ICD-10-CM

## 2013-05-31 DIAGNOSIS — E785 Hyperlipidemia, unspecified: Secondary | ICD-10-CM

## 2013-05-31 DIAGNOSIS — I10 Essential (primary) hypertension: Secondary | ICD-10-CM

## 2013-05-31 LAB — CBC WITH DIFFERENTIAL/PLATELET
BASOS PCT: 0 % (ref 0–1)
Basophils Absolute: 0 10*3/uL (ref 0.0–0.1)
Eosinophils Absolute: 0.1 10*3/uL (ref 0.0–0.7)
Eosinophils Relative: 1 % (ref 0–5)
HCT: 42 % (ref 36.0–46.0)
HEMOGLOBIN: 14.6 g/dL (ref 12.0–15.0)
LYMPHS PCT: 33 % (ref 12–46)
Lymphs Abs: 2.1 10*3/uL (ref 0.7–4.0)
MCH: 30.2 pg (ref 26.0–34.0)
MCHC: 34.8 g/dL (ref 30.0–36.0)
MCV: 86.8 fL (ref 78.0–100.0)
MONOS PCT: 8 % (ref 3–12)
Monocytes Absolute: 0.5 10*3/uL (ref 0.1–1.0)
NEUTROS ABS: 3.7 10*3/uL (ref 1.7–7.7)
Neutrophils Relative %: 58 % (ref 43–77)
Platelets: 321 10*3/uL (ref 150–400)
RBC: 4.84 MIL/uL (ref 3.87–5.11)
RDW: 12.8 % (ref 11.5–15.5)
WBC: 6.4 10*3/uL (ref 4.0–10.5)

## 2013-05-31 LAB — BASIC METABOLIC PANEL WITH GFR
BUN: 10 mg/dL (ref 6–23)
CO2: 30 mEq/L (ref 19–32)
Calcium: 10.2 mg/dL (ref 8.4–10.5)
Chloride: 96 mEq/L (ref 96–112)
Creat: 0.67 mg/dL (ref 0.50–1.10)
Glucose, Bld: 98 mg/dL (ref 70–99)
POTASSIUM: 4.1 meq/L (ref 3.5–5.3)
Sodium: 136 mEq/L (ref 135–145)

## 2013-05-31 LAB — LIPID PANEL
Cholesterol: 236 mg/dL — ABNORMAL HIGH (ref 0–200)
HDL: 131 mg/dL (ref >39)
LDL Cholesterol: 89 mg/dL (ref 0–99)
Total CHOL/HDL Ratio: 1.8 Ratio
Triglycerides: 80 mg/dL (ref <150)
VLDL: 16 mg/dL (ref 0–40)

## 2013-05-31 LAB — MAGNESIUM: Magnesium: 2.1 mg/dL (ref 1.5–2.5)

## 2013-05-31 LAB — TSH: TSH: 1.882 u[IU]/mL (ref 0.350–4.500)

## 2013-05-31 MED ORDER — BUPROPION HCL ER (XL) 300 MG PO TB24: 300.0000 mg | ORAL_TABLET | Freq: Every day | ORAL | Status: DC

## 2013-05-31 MED ORDER — AMPHETAMINE-DEXTROAMPHETAMINE 30 MG PO TABS: 30.0000 mg | ORAL_TABLET | Freq: Two times a day (BID) | ORAL | Status: DC

## 2013-05-31 NOTE — Progress Notes (Signed)
HPI 58 y.o. female  presents for 3 month follow up with hypertension, hyperlipidemia, prediabetes and vitamin D. Her blood pressure has been controlled at home, today their BP is BP: 138/80 mmHg She does workout. She denies chest pain, shortness of breath, dizziness.  She is not on cholesterol medication and denies myalgias. Her cholesterol is at goal. The cholesterol last visit was:   Lab Results  Component Value Date   CHOL 260* 02/08/2013   HDL 130 02/08/2013   LDLCALC 120* 02/08/2013   TRIG 50 02/08/2013   CHOLHDL 2.0 02/08/2013   She is being seen by Hepatitis clinic in [place] and has not qualified for treatment but she is trying to get that appealed.  Patient is on Vitamin D supplement.     Current Medications:  Current Outpatient Prescriptions on File Prior to Visit  Medication Sig Dispense Refill  . ALPRAZolam (XANAX) 0.5 MG tablet TAKE 1 TABLET TWICE A DAY AS NEEDED FOR ANXIETY  60 tablet  1  . amphetamine-dextroamphetamine (ADDERALL) 15 MG tablet Take 1 tablet (15 mg total) by mouth 2 (two) times daily with a meal.  60 tablet  0  . buPROPion (WELLBUTRIN XL) 300 MG 24 hr tablet TAKE 1 TABLET BY MOUTH EVERY DAY  30 tablet  0  . lisinopril-hydrochlorothiazide (PRINZIDE,ZESTORETIC) 20-25 MG per tablet Take 1 tablet by mouth daily.       No current facility-administered medications on file prior to visit.   Medical History:  Past Medical History  Diagnosis Date  . Hyperlipidemia   . Hypertension   . ADD (attention deficit disorder)   . Cancer 2004    Breast   . Hepatitis C 09/2012    Genotype 1a, sent to Hep Clinic   Allergies: No Known Allergies   Review of Systems: [X]  = complains of  [ ]  = denies  General: Fatigue [ ]  Fever [ ]  Chills [ ]  Weakness [ ]   Insomnia [ ]  Eyes: Redness [ ]  Blurred vision [ ]  Diplopia [ ]   ENT: Congestion [ ]  Sinus Pain [ ]  Post Nasal Drip [ ]  Sore Throat [ ]  Earache [ ]   Cardiac: Chest pain/pressure [ ]  SOB [ ]  Orthopnea [ ]   Palpitations [ ]    Paroxysmal nocturnal dyspnea[ ]  Claudication [ ]  Edema [ ]   Pulmonary: Cough [ ]  Wheezing[ ]   SOB [ ]   Snoring [ ]   GI: Nausea [ ]  Vomiting[ ]  Dysphagia[ ]  Heartburn[ ]  Abdominal pain [ ]  Constipation [ ] ; Diarrhea [ ] ; BRBPR [ ]  Melena[ ]  GU: Hematuria[ ]  Dysuria [ ]  Nocturia[ ]  Urgency [ ]   Hesitancy [ ]  Discharge [ ]  Neuro: Headaches[ ]  Vertigo[ ]  Paresthesias[ ]  Spasm [ ]  Speech changes [ ]  Incoordination [ ]   Ortho: Arthritis [ ]  Joint pain [ ]  Muscle pain [ ]  Joint swelling [ ]  Back Pain [ ]  Skin:  Rash [ ]   Pruritis [ ]  Change in skin lesion [ ]   Psych: Depression[ ]  Anxiety[ ]  Confusion [ ]  Memory loss [ ]   Heme/Lypmh: Bleeding [ ]  Bruising [ ]  Enlarged lymph nodes [ ]   Endocrine: Visual blurring [ ]  Paresthesia [ ]  Polyuria [ ]  Polydypsea [ ]    Heat/cold intolerance [ ]  Hypoglycemia [ ]   Family history- Review and unchanged Social history- Review and unchanged Physical Exam: BP 138/80  Pulse 100  Temp(Src) 98.1 F (36.7 C)  Resp 16  Wt 116 lb (52.617 kg) Wt Readings from Last 3 Encounters:  05/31/13 116  lb (52.617 kg)  02/08/13 115 lb (52.164 kg)  12/29/12 119 lb (53.978 kg)   General Appearance: Well nourished, in no apparent distress. Eyes: PERRLA, EOMs, conjunctiva no swelling or erythema Sinuses: No Frontal/maxillary tenderness ENT/Mouth: Ext aud canals clear, TMs without erythema, bulging. No erythema, swelling, or exudate on post pharynx.  Tonsils not swollen or erythematous. Hearing normal.  Neck: Supple, thyroid normal.  Respiratory: Respiratory effort normal, BS equal bilaterally without rales, rhonchi, wheezing or stridor.  Cardio: RRR with no MRGs. Brisk peripheral pulses without edema.  Abdomen: Soft, + BS.  Non tender, no guarding, rebound, hernias, masses. Lymphatics: Non tender without lymphadenopathy.  Musculoskeletal: Full ROM, 5/5 strength, normal gait.  Skin: Warm, dry without rashes, lesions, ecchymosis.  Neuro: Cranial nerves intact. Normal muscle  tone, no cerebellar symptoms. Sensation intact.  Psych: Awake and oriented X 3, normal affect, Insight and Judgment appropriate.   Assessment and Plan:  Hypertension: Continue medication, monitor blood pressure at home. Continue DASH diet. Cholesterol: Continue diet and exercise. Check cholesterol.  ADD medication- doing well on that, will increase to 72m for her to break in half and have prescription last longer.  Vitamin D Def- check level and continue medications.   Continue diet and meds as discussed. Further disposition pending results of labs.  AVicie Mutters3:11 PM

## 2013-05-31 NOTE — Patient Instructions (Signed)
Cholesterol Cholesterol is a white, waxy, fat-like protein needed by your body in small amounts. The liver makes all the cholesterol you need. It is carried from the liver by the blood through the blood vessels. Deposits (plaque) may build up on blood vessel walls. This makes the arteries narrower and stiffer. Plaque increases the risk for heart attack and stroke. You cannot feel your cholesterol level even if it is very high. The only way to know is by a blood test to check your lipid (fats) levels. Once you know your cholesterol levels, you should keep a record of the test results. Work with your caregiver to to keep your levels in the desired range. WHAT THE RESULTS MEAN:  Total cholesterol is a rough measure of all the cholesterol in your blood.  LDL is the so-called bad cholesterol. This is the type that deposits cholesterol in the walls of the arteries. You want this level to be low.  HDL is the good cholesterol because it cleans the arteries and carries the LDL away. You want this level to be high.  Triglycerides are fat that the body can either burn for energy or store. High levels are closely linked to heart disease. DESIRED LEVELS:  Total cholesterol below 200.  LDL below 100 for people at risk, below 70 for very high risk.  HDL above 50 is good, above 60 is best.  Triglycerides below 150. HOW TO LOWER YOUR CHOLESTEROL:  Diet.  Choose fish or white meat chicken and [place], roasted or baked. Limit fatty cuts of red meat, fried foods, and processed meats, such as sausage and lunch meat.  Eat lots of fresh fruits and vegetables. Choose whole grains, beans, pasta, potatoes and cereals.  Use only small amounts of olive, corn or canola oils. Avoid butter, mayonnaise, shortening or palm kernel oils. Avoid foods with trans-fats.  Use skim/nonfat milk and low-fat/nonfat yogurt and cheeses. Avoid whole milk, cream, ice cream, egg yolks and cheeses. Healthy desserts include angel food  cake, ginger snaps, animal crackers, hard candy, popsicles, and low-fat/nonfat frozen yogurt. Avoid pastries, cakes, pies and cookies.  Exercise.  A regular program helps decrease LDL and raises HDL.  Helps with weight control.  Do things that increase your activity level like gardening, walking, or taking the stairs.  Medication.  May be prescribed by your caregiver to help lowering cholesterol and the risk for heart disease.  You may need medicine even if your levels are normal if you have several risk factors. HOME CARE INSTRUCTIONS   Follow your diet and exercise programs as suggested by your caregiver.  Take medications as directed.  Have blood work done when your caregiver feels it is necessary. MAKE SURE YOU:   Understand these instructions.  Will watch your condition.  Will get help right away if you are not doing well or get worse. Document Released: 10/15/2000 Document Revised: 04/14/2011 Document Reviewed: 11/03/2012 [facility] Patient Information 2014 [place], [place].

## 2013-06-01 LAB — VITAMIN D 25 HYDROXY (VIT D DEFICIENCY, FRACTURES): VIT D 25 HYDROXY: 76 ng/mL (ref 30–89)

## 2013-07-27 ENCOUNTER — Encounter: Payer: Self-pay | Admitting: Physician Assistant

## 2013-07-28 MED ORDER — AMPHETAMINE-DEXTROAMPHETAMINE 30 MG PO TABS: 30.0000 mg | ORAL_TABLET | Freq: Two times a day (BID) | ORAL | Status: DC

## 2013-08-05 ENCOUNTER — Inpatient Hospital Stay (HOSPITAL_COMMUNITY)
Admission: EM | Admit: 2013-08-05 | Discharge: 2013-08-07 | DRG: 392 | Disposition: A | Discharge status: Home / Self Care | Payer: PRIVATE HEALTH INSURANCE | Attending: Internal Medicine | Admitting: Internal Medicine

## 2013-08-05 ENCOUNTER — Emergency Department (HOSPITAL_COMMUNITY): Payer: PRIVATE HEALTH INSURANCE

## 2013-08-05 ENCOUNTER — Encounter (HOSPITAL_COMMUNITY): Payer: Self-pay | Admitting: Emergency Medicine

## 2013-08-05 DIAGNOSIS — K922 Gastrointestinal hemorrhage, unspecified: Secondary | ICD-10-CM

## 2013-08-05 DIAGNOSIS — Z809 Family history of malignant neoplasm, unspecified: Secondary | ICD-10-CM

## 2013-08-05 DIAGNOSIS — Z853 Personal history of malignant neoplasm of breast: Secondary | ICD-10-CM

## 2013-08-05 DIAGNOSIS — E785 Hyperlipidemia, unspecified: Secondary | ICD-10-CM

## 2013-08-05 DIAGNOSIS — Z87891 Personal history of nicotine dependence: Secondary | ICD-10-CM

## 2013-08-05 DIAGNOSIS — Z8249 Family history of ischemic heart disease and other diseases of the circulatory system: Secondary | ICD-10-CM

## 2013-08-05 DIAGNOSIS — F988 Other specified behavioral and emotional disorders with onset usually occurring in childhood and adolescence: Secondary | ICD-10-CM | POA: Diagnosis present

## 2013-08-05 DIAGNOSIS — Z823 Family history of stroke: Secondary | ICD-10-CM

## 2013-08-05 DIAGNOSIS — I10 Essential (primary) hypertension: Secondary | ICD-10-CM | POA: Diagnosis present

## 2013-08-05 DIAGNOSIS — B1920 Unspecified viral hepatitis C without hepatic coma: Secondary | ICD-10-CM | POA: Diagnosis present

## 2013-08-05 DIAGNOSIS — K5289 Other specified noninfective gastroenteritis and colitis: Principal | ICD-10-CM

## 2013-08-05 DIAGNOSIS — K529 Noninfective gastroenteritis and colitis, unspecified: Secondary | ICD-10-CM

## 2013-08-05 DIAGNOSIS — Z9851 Tubal ligation status: Secondary | ICD-10-CM

## 2013-08-05 DIAGNOSIS — Z79899 Other long term (current) drug therapy: Secondary | ICD-10-CM

## 2013-08-05 LAB — CBC WITH DIFFERENTIAL/PLATELET
Basophils Absolute: 0 10*3/uL (ref 0.0–0.1)
Basophils Relative: 0 % (ref 0–1)
Eosinophils Absolute: 0 10*3/uL (ref 0.0–0.7)
Eosinophils Relative: 0 % (ref 0–5)
HEMATOCRIT: 42.2 % (ref 36.0–46.0)
HEMOGLOBIN: 14.7 g/dL (ref 12.0–15.0)
LYMPHS PCT: 5 % — AB (ref 12–46)
Lymphs Abs: 0.7 10*3/uL (ref 0.7–4.0)
MCH: 31.1 pg (ref 26.0–34.0)
MCHC: 34.8 g/dL (ref 30.0–36.0)
MCV: 89.4 fL (ref 78.0–100.0)
MONO ABS: 1.3 10*3/uL — AB (ref 0.1–1.0)
MONOS PCT: 9 % (ref 3–12)
NEUTROS PCT: 86 % — AB (ref 43–77)
Neutro Abs: 12.4 10*3/uL — ABNORMAL HIGH (ref 1.7–7.7)
Platelets: 259 10*3/uL (ref 150–400)
RBC: 4.72 MIL/uL (ref 3.87–5.11)
RDW: 12.5 % (ref 11.5–15.5)
WBC: 14.4 10*3/uL — ABNORMAL HIGH (ref 4.0–10.5)

## 2013-08-05 LAB — COMPREHENSIVE METABOLIC PANEL
ALBUMIN: 4.5 g/dL (ref 3.5–5.2)
ALK PHOS: 135 U/L — AB (ref 39–117)
ALT: 92 U/L — ABNORMAL HIGH (ref 0–35)
ANION GAP: 13 (ref 5–15)
AST: 57 U/L — ABNORMAL HIGH (ref 0–37)
BILIRUBIN TOTAL: 0.6 mg/dL (ref 0.3–1.2)
BUN: 15 mg/dL (ref 6–23)
CHLORIDE: 100 meq/L (ref 96–112)
CO2: 26 meq/L (ref 19–32)
CREATININE: 0.69 mg/dL (ref 0.50–1.10)
Calcium: 10.4 mg/dL (ref 8.4–10.5)
GLUCOSE: 129 mg/dL — AB (ref 70–99)
Potassium: 4.4 mEq/L (ref 3.7–5.3)
Sodium: 139 mEq/L (ref 137–147)
Total Protein: 8.1 g/dL (ref 6.0–8.3)

## 2013-08-05 LAB — URINE MICROSCOPIC-ADD ON

## 2013-08-05 LAB — URINALYSIS, ROUTINE W REFLEX MICROSCOPIC
GLUCOSE, UA: NEGATIVE mg/dL
Ketones, ur: 15 mg/dL — AB
Leukocytes, UA: NEGATIVE
Nitrite: NEGATIVE
Protein, ur: NEGATIVE mg/dL
Specific Gravity, Urine: 1.026 (ref 1.005–1.030)
Urobilinogen, UA: 0.2 mg/dL (ref 0.0–1.0)
pH: 6 (ref 5.0–8.0)

## 2013-08-05 LAB — POC OCCULT BLOOD, ED: Fecal Occult Bld: POSITIVE — AB

## 2013-08-05 LAB — HEMOGLOBIN: Hemoglobin: 13.4 g/dL (ref 12.0–15.0)

## 2013-08-05 LAB — LIPASE, BLOOD: Lipase: 26 U/L (ref 11–59)

## 2013-08-05 LAB — PROTIME-INR
INR: 0.9 (ref 0.00–1.49)
Prothrombin Time: 12.2 seconds (ref 11.6–15.2)

## 2013-08-05 LAB — ABO/RH: ABO/RH(D): B POS

## 2013-08-05 LAB — PREPARE RBC (CROSSMATCH)

## 2013-08-05 LAB — APTT: aPTT: 23 seconds — ABNORMAL LOW (ref 24–37)

## 2013-08-05 MED ORDER — SODIUM CHLORIDE 0.9 % IJ SOLN: 3.0000 mL | Freq: Two times a day (BID) | INTRAMUSCULAR | Status: DC

## 2013-08-05 MED ORDER — ADULT MULTIVITAMIN W/MINERALS CH
1.0000 | ORAL_TABLET | Freq: Every day | ORAL | Status: DC
  Administered 2013-08-05: 1 via ORAL
  Filled 2013-08-05 (×3): qty 1

## 2013-08-05 MED ORDER — METRONIDAZOLE IN NACL 5-0.79 MG/ML-% IV SOLN
500.0000 mg | Freq: Three times a day (TID) | INTRAVENOUS | Status: DC
  Administered 2013-08-05 – 2013-08-07 (×5): 500 mg via INTRAVENOUS
  Filled 2013-08-05 (×6): qty 100

## 2013-08-05 MED ORDER — ALPRAZOLAM 0.5 MG PO TABS
0.5000 mg | ORAL_TABLET | Freq: Two times a day (BID) | ORAL | Status: DC
  Administered 2013-08-05 – 2013-08-07 (×4): 0.5 mg via ORAL
  Filled 2013-08-05 (×4): qty 1

## 2013-08-05 MED ORDER — ONDANSETRON HCL 4 MG/2ML IJ SOLN
4.0000 mg | Freq: Once | INTRAMUSCULAR | Status: AC
  Administered 2013-08-05: 4 mg via INTRAVENOUS
  Filled 2013-08-05: qty 2

## 2013-08-05 MED ORDER — CIPROFLOXACIN IN D5W 400 MG/200ML IV SOLN
400.0000 mg | Freq: Once | INTRAVENOUS | Status: AC
  Administered 2013-08-05: 400 mg via INTRAVENOUS
  Filled 2013-08-05: qty 200

## 2013-08-05 MED ORDER — LISINOPRIL-HYDROCHLOROTHIAZIDE 20-25 MG PO TABS: 1.0000 | ORAL_TABLET | Freq: Every day | ORAL | Status: DC

## 2013-08-05 MED ORDER — SODIUM CHLORIDE 0.9 % IV BOLUS (SEPSIS)
1000.0000 mL | Freq: Once | INTRAVENOUS | Status: AC
  Administered 2013-08-05: 1000 mL via INTRAVENOUS

## 2013-08-05 MED ORDER — ACETAMINOPHEN 325 MG PO TABS: 650.0000 mg | ORAL_TABLET | Freq: Four times a day (QID) | ORAL | Status: DC | PRN

## 2013-08-05 MED ORDER — SODIUM CHLORIDE 0.9 % IV SOLN
INTRAVENOUS | Status: DC
  Administered 2013-08-05 – 2013-08-07 (×2): via INTRAVENOUS

## 2013-08-05 MED ORDER — CIPROFLOXACIN IN D5W 400 MG/200ML IV SOLN
400.0000 mg | Freq: Two times a day (BID) | INTRAVENOUS | Status: DC
  Administered 2013-08-06 – 2013-08-07 (×3): 400 mg via INTRAVENOUS
  Filled 2013-08-05 (×3): qty 200

## 2013-08-05 MED ORDER — FUROSEMIDE 10 MG/ML IJ SOLN: 20.0000 mg | Freq: Once | INTRAMUSCULAR | Status: DC

## 2013-08-05 MED ORDER — ZOLPIDEM TARTRATE 5 MG PO TABS: 5.0000 mg | ORAL_TABLET | Freq: Every evening | ORAL | Status: DC | PRN

## 2013-08-05 MED ORDER — DIPHENHYDRAMINE HCL 25 MG PO CAPS: 25.0000 mg | ORAL_CAPSULE | Freq: Once | ORAL | Status: DC

## 2013-08-05 MED ORDER — OXYCODONE HCL 5 MG PO TABS
5.0000 mg | ORAL_TABLET | ORAL | Status: DC | PRN
  Administered 2013-08-05: 5 mg via ORAL
  Filled 2013-08-05: qty 1

## 2013-08-05 MED ORDER — ONDANSETRON HCL 4 MG/2ML IJ SOLN
4.0000 mg | Freq: Four times a day (QID) | INTRAMUSCULAR | Status: DC | PRN
  Administered 2013-08-05 – 2013-08-07 (×5): 4 mg via INTRAVENOUS
  Filled 2013-08-05 (×6): qty 2

## 2013-08-05 MED ORDER — ACETAMINOPHEN 650 MG RE SUPP: 650.0000 mg | Freq: Four times a day (QID) | RECTAL | Status: DC | PRN

## 2013-08-05 MED ORDER — LISINOPRIL 20 MG PO TABS
20.0000 mg | ORAL_TABLET | Freq: Every day | ORAL | Status: DC
  Administered 2013-08-05 – 2013-08-07 (×3): 20 mg via ORAL
  Filled 2013-08-05 (×3): qty 1

## 2013-08-05 MED ORDER — ONDANSETRON HCL 4 MG PO TABS
4.0000 mg | ORAL_TABLET | Freq: Four times a day (QID) | ORAL | Status: DC | PRN
  Administered 2013-08-07: 4 mg via ORAL
  Filled 2013-08-05: qty 1

## 2013-08-05 MED ORDER — HYDROCHLOROTHIAZIDE 25 MG PO TABS
25.0000 mg | ORAL_TABLET | Freq: Every day | ORAL | Status: DC
  Administered 2013-08-05 – 2013-08-07 (×3): 25 mg via ORAL
  Filled 2013-08-05 (×3): qty 1

## 2013-08-05 MED ORDER — BUPROPION HCL ER (XL) 300 MG PO TB24
300.0000 mg | ORAL_TABLET | Freq: Every day | ORAL | Status: DC
  Filled 2013-08-05 (×3): qty 1

## 2013-08-05 MED ORDER — METRONIDAZOLE IN NACL 5-0.79 MG/ML-% IV SOLN: 500.0000 mg | Freq: Once | INTRAVENOUS | Status: DC

## 2013-08-05 MED ORDER — IOHEXOL 300 MG/ML  SOLN
100.0000 mL | Freq: Once | INTRAMUSCULAR | Status: AC | PRN
  Administered 2013-08-05: 100 mL via INTRAVENOUS

## 2013-08-05 MED ORDER — IOHEXOL 300 MG/ML  SOLN
50.0000 mL | Freq: Once | INTRAMUSCULAR | Status: AC | PRN
  Administered 2013-08-05: 50 mL via ORAL

## 2013-08-05 MED ORDER — AMPHETAMINE-DEXTROAMPHETAMINE 10 MG PO TABS
15.0000 mg | ORAL_TABLET | Freq: Two times a day (BID) | ORAL | Status: DC
  Filled 2013-08-05: qty 2

## 2013-08-05 NOTE — H&P (Signed)
[facility]                                                                                    Patient Demographics  [name], is a 58 y.o. female  CSN: [id number]  MRN: [id number]  DOB - 1955-06-24  Admit Date - 08/05/2013  Outpatient Primary MD for the patient is Alesia Richards, MD   With History of -  Past Medical History  Diagnosis Date  . Hyperlipidemia   . Hypertension   . ADD (attention deficit disorder)   . Cancer 2004    Breast   . Hepatitis C 09/2012    Genotype 1a, sent to [facility]      Past Surgical History  Procedure Laterality Date  . Leep  1994  . Tubal ligation  2004  . Ovarian cyst removal Left 2004    in for   Chief Complaint  Patient presents with  . Abdominal Pain  . Rectal Bleeding     HPI  [name]  is a 58 y.o. female, with past medical history significant for hepatitis C and hypertension presenting today with one-day history of lower crampy abdominal pain associated with nausea vomiting and bright red per rectum. This continued with episodes since 3 AM when she woke up with that. No history of antibiotics lately. No history of fever or chills. Patient denies any history of colitis or Crohn's disease .    Review of Systems    In addition to the HPI above,  No Fever-chills, No Headache, No changes with Vision or hearing, No problems swallowing food or Liquids, No Chest pain, Cough or Shortness of Breath, No Blood in stool or Urine, No dysuria, No new skin rashes or bruises, No new joints pains-aches,  No new weakness, tingling, numbness in any extremity, No recent weight gain or loss, No polyuria, polydypsia or polyphagia, No significant Mental Stressors.  A full 10 point Review of Systems was done, except as stated above, all other Review of Systems were negative.   Social History History  Substance Use Topics  . Smoking status: Former Smoker    Types: Cigarettes    Quit date: 02/04/1988  .  Smokeless tobacco: Never Used  . Alcohol Use: Yes     Family History Family History  Problem Relation Age of Onset  . Cancer Mother   . Stroke Father   . Hypertension Father   . Cancer Sister      Prior to Admission medications   Medication Sig Start Date End Date Taking? Authorizing Provider  ALPRAZolam Duanne Moron) 0.5 MG tablet Take by mouth 2 (two) times daily.   Yes Historical Provider, MD  amphetamine-dextroamphetamine (ADDERALL) 30 MG tablet Take 1 tablet (30 mg total) by mouth 2 (two) times daily. 07/28/13 07/28/14 Yes Vicie Mutters, PA-C  amphetamine-dextroamphetamine (ADDERALL) 30 MG tablet Take 15 mg by mouth 2 (two) times daily.   Yes Historical Provider, MD  buPROPion (WELLBUTRIN XL) 300 MG 24 hr tablet Take 1 tablet (300 mg total) by mouth daily. 05/31/13  Yes Vicie Mutters, PA-C  lisinopril-hydrochlorothiazide (PRINZIDE,ZESTORETIC) 20-25 MG per tablet Take 1 tablet by mouth daily.   Yes Historical  Provider, MD  MILK THISTLE PO Take 1 tablet by mouth daily.   Yes Historical Provider, MD  Multiple Vitamin (MULTIVITAMIN WITH MINERALS) TABS tablet Take 1 tablet by mouth daily.   Yes Historical Provider, MD  Probiotic Product (PROBIOTIC DAILY PO) Take 1 tablet by mouth daily.   Yes Historical Provider, MD  ALPRAZolam Duanne Moron) 0.5 MG tablet TAKE 1 TABLET TWICE A DAY AS NEEDED FOR ANXIETY 05/26/13   Melissa R Smith, PA-C    No Known Allergies  Physical Exam  Vitals  Blood pressure 155/96, pulse 99, temperature 98.6 F (37 C), temperature source Oral, resp. rate 13, height 5' 2"  (1.575 m), weight 52.164 kg (115 lb), SpO2 99.00%.   1. General middle-aged white American female looks anxious. Very pleasant.  2. Normal affect and insight, Not Suicidal or Homicidal, Awake Alert, Oriented X 3.  3. No F.N deficits, ALL C.Nerves Intact, Strength 5/5 all 4 extremities, Sensation intact all 4 extremities, Plantars down going.  4. Ears and Eyes appear Normal, Conjunctivae clear,  PERRLA. Moist Oral Mucosa.  5. Supple Neck, No JVD, No cervical lymphadenopathy appriciated, No Carotid Bruits.  6. Symmetrical Chest wall movement, Good air movement bilaterally, CTAB.  7. RRR, No Gallops, Rubs or Murmurs, No Parasternal Heave.  8. Positive Bowel Sounds, Abdomen Soft, lower abdominal tenderness, No organomegaly appriciated,No rebound -guarding or rigidity.  9.  No Cyanosis, Normal Skin Turgor, No Skin Rash or Bruise.  10. Good muscle tone,  joints appear normal , no effusions, Normal ROM.  11. No Palpable Lymph Nodes in Neck or Axillae    Data Review  CBC  Recent Labs Lab 08/05/13 1635  WBC 14.4*  HGB 14.7  HCT 42.2  PLT 259  MCV 89.4  MCH 31.1  MCHC 34.8  RDW 12.5  LYMPHSABS 0.7  MONOABS 1.3*  EOSABS 0.0  BASOSABS 0.0   ------------------------------------------------------------------------------------------------------------------  Chemistries   Recent Labs Lab 08/05/13 1635  NA 139  K 4.4  CL 100  CO2 26  GLUCOSE 129*  BUN 15  CREATININE 0.69  CALCIUM 10.4  AST 57*  ALT 92*  ALKPHOS 135*  BILITOT 0.6   ------------------------------------------------------------------------------------------------------------------ estimated creatinine clearance is 62.1 ml/min (by C-G formula based on Cr of 0.69). ------------------------------------------------------------------------------------------------------------------ No results found for this basename: TSH, T4TOTAL, FREET3, T3FREE, THYROIDAB,  in the last 72 hours   Coagulation profile  Recent Labs Lab 08/05/13 1803  INR 0.90   ------------------------------------------------------------------------------------------------------------------- No results found for this basename: DDIMER,  in the last 72 hours -------------------------------------------------------------------------------------------------------------------  Cardiac Enzymes No results found for this basename: CK,  CKMB, TROPONINI, MYOGLOBIN,  in the last 168 hours ------------------------------------------------------------------------------------------------------------------ No components found with this basename: POCBNP,    ---------------------------------------------------------------------------------------------------------------  Urinalysis    Component Value Date/Time   COLORURINE AMBER* 08/05/2013 1826   APPEARANCEUR CLEAR 08/05/2013 1826   LABSPEC 1.026 08/05/2013 1826   PHURINE 6.0 08/05/2013 1826   GLUCOSEU NEGATIVE 08/05/2013 1826   HGBUR SMALL* 08/05/2013 1826   BILIRUBINUR SMALL* 08/05/2013 1826   KETONESUR 15* 08/05/2013 1826   PROTEINUR NEGATIVE 08/05/2013 1826   UROBILINOGEN 0.2 08/05/2013 1826   NITRITE NEGATIVE 08/05/2013 1826   LEUKOCYTESUR NEGATIVE 08/05/2013 1826    ----------------------------------------------------------------------------------------------------------------     Imaging results:   Ct Abdomen Pelvis W Contrast  08/05/2013   CLINICAL DATA:  Bloody stools and abdominal pain with new diagnosis of hepatitis-C.  EXAM: CT ABDOMEN AND PELVIS WITH CONTRAST  TECHNIQUE: Multidetector CT imaging of the abdomen and pelvis was performed using the  standard protocol following bolus administration of intravenous contrast.  CONTRAST:  112m OMNIPAQUE IOHEXOL 300 MG/ML SOLN intravenously semi: The patient also received oral contrast material.  COMPARISON:  Abdominal CT scan of October 25, 2010  FINDINGS: There is long segment thickening of the wall of the descending colon and proximal rectosigmoid. A few diverticula are visible. There is no obstruction to passage of the oral contrast which has reached the rectum. More proximally the colon and small bowel are normal. There is a normal appendix and terminal ileum.  The liver exhibits mildly decreased density consistent with fatty infiltration. There is no focal mass or ductal dilation. The gallbladder, pancreas, spleen, adrenal glands, and  kidneys are normal. The abdominal aorta is normal in caliber. The uterus and adnexal structures are normal for age. Tubal ligation clips are present bilaterally. The urinary bladder is normal.  There is disc space narrowing at L5-S1. The bony pelvis is unremarkable. The lung bases are clear.  IMPRESSION: 1. There is diffuse mural thickening of the descending colon and proximal rectosigmoid consistent with colitis. There is no evidence of obstruction, perforation, or abscess formation. 2. There is no acute abnormality elsewhere within the abdomen or pelvis.   Electronically Signed   By: David  JMartinique  On: 08/05/2013 20:50      Assessment & Plan  1. Colitis ; with bright red blood per rectum    Follow hemoglobin hematocrit    Transfuse when necessary    Cipro/Flagyl IV    Check stool for C. difficile and lactoferrin and Hemoccult 2. Hypertension     Continue same medications 3. ADD     Continue Adderall 4. History of breast cancer   DVT Prophylaxis SCDs  AM Labs Ordered, also please review Full Orders  Code Status full  Disposition Plan: Home  Time spent in minutes : 33 minutes  Condition GUARDED   @SIGNATURE @

## 2013-08-05 NOTE — ED Notes (Signed)
Per pt, states she started having abdominal pain early this am-went to bathroom and noticed blood-states she has been bleeding on and off all day-recently diagnosed with Hep C

## 2013-08-06 ENCOUNTER — Inpatient Hospital Stay (HOSPITAL_COMMUNITY): Payer: PRIVATE HEALTH INSURANCE

## 2013-08-06 DIAGNOSIS — K922 Gastrointestinal hemorrhage, unspecified: Secondary | ICD-10-CM

## 2013-08-06 LAB — BASIC METABOLIC PANEL
Anion gap: 12 (ref 5–15)
BUN: 12 mg/dL (ref 6–23)
CO2: 27 mEq/L (ref 19–32)
Calcium: 9.4 mg/dL (ref 8.4–10.5)
Chloride: 97 mEq/L (ref 96–112)
Creatinine, Ser: 0.74 mg/dL (ref 0.50–1.10)
GFR calc Af Amer: >90 mL/min (ref >90)
GFR calc non Af Amer: >90 mL/min (ref >90)
GLUCOSE: 132 mg/dL — AB (ref 70–99)
POTASSIUM: 4.2 meq/L (ref 3.7–5.3)
SODIUM: 136 meq/L — AB (ref 137–147)

## 2013-08-06 LAB — CBC
HEMATOCRIT: 38.9 % (ref 36.0–46.0)
Hemoglobin: 13.5 g/dL (ref 12.0–15.0)
MCH: 31.6 pg (ref 26.0–34.0)
MCHC: 34.7 g/dL (ref 30.0–36.0)
MCV: 91.1 fL (ref 78.0–100.0)
Platelets: 226 10*3/uL (ref 150–400)
RBC: 4.27 MIL/uL (ref 3.87–5.11)
RDW: 12.8 % (ref 11.5–15.5)
WBC: 14.8 10*3/uL — AB (ref 4.0–10.5)

## 2013-08-06 LAB — CLOSTRIDIUM DIFFICILE BY PCR: CDIFFPCR: NEGATIVE

## 2013-08-06 LAB — HEMOGLOBIN
HEMOGLOBIN: 12.7 g/dL (ref 12.0–15.0)
HEMOGLOBIN: 12.5 g/dL (ref 12.0–15.0)
Hemoglobin: 12.6 g/dL (ref 12.0–15.0)

## 2013-08-06 MED ORDER — MORPHINE SULFATE 2 MG/ML IJ SOLN
1.0000 mg | INTRAMUSCULAR | Status: DC | PRN
  Administered 2013-08-06 (×3): 1 mg via INTRAVENOUS
  Filled 2013-08-06 (×3): qty 1

## 2013-08-06 MED ORDER — MORPHINE SULFATE 2 MG/ML IJ SOLN
2.0000 mg | INTRAMUSCULAR | Status: DC | PRN
  Administered 2013-08-06 – 2013-08-07 (×3): 2 mg via INTRAVENOUS
  Filled 2013-08-06 (×3): qty 1

## 2013-08-06 MED ORDER — MORPHINE SULFATE 2 MG/ML IJ SOLN
1.0000 mg | Freq: Once | INTRAMUSCULAR | Status: AC
  Administered 2013-08-06: 1 mg via INTRAVENOUS
  Filled 2013-08-06: qty 1

## 2013-08-06 NOTE — ED Provider Notes (Signed)
CSN: [id number]     Arrival date & time 08/05/13  1559 History   First MD Initiated Contact with Patient 08/05/13 1712     Chief Complaint  Patient presents with  . Abdominal Pain  . Rectal Bleeding     (Consider location/radiation/quality/duration/timing/severity/associated sxs/prior Treatment) HPI Pt presents with c/o lower abdominal cramping intermittently throughout the day today.  She also has been having bright red blood per rectum.  No fever/chills.  Blood has some clots in it.  No vomiting or nausea.  Passing blood causes cramps to decrease.  No recent travel.  No hx of similar symptoms in the past.  States she had a normal screening colonoscopy several years ago.  No syncope or lightheadedness but has felt fatigued today.  There are no other associated systemic symptoms, there are no other alleviating or modifying factors.   Past Medical History  Diagnosis Date  . Hyperlipidemia   . Hypertension   . ADD (attention deficit disorder)   . Cancer 2004    Breast   . Hepatitis C 09/2012    Genotype 1a, sent to [facility]   Past Surgical History  Procedure Laterality Date  . Leep  1994  . Tubal ligation  2004  . Ovarian cyst removal Left 2004   Family History  Problem Relation Age of Onset  . Cancer Mother   . Stroke Father   . Hypertension Father   . Cancer Sister    History  Substance Use Topics  . Smoking status: Former Smoker    Types: Cigarettes    Quit date: 02/04/1988  . Smokeless tobacco: Never Used  . Alcohol Use: Yes   OB History   Grav Para Term Preterm Abortions TAB SAB Ect Mult Living                 Review of Systems ROS reviewed and all otherwise negative except for mentioned in HPI    Allergies  Review of patient's allergies indicates no known allergies.  Home Medications   Prior to Admission medications   Medication Sig Start Date End Date Taking? Authorizing Provider  ALPRAZolam Duanne Moron) 0.5 MG tablet Take by mouth 2 (two) times daily.    Yes Historical Provider, MD  amphetamine-dextroamphetamine (ADDERALL) 30 MG tablet Take 1 tablet (30 mg total) by mouth 2 (two) times daily. 07/28/13 07/28/14 Yes Vicie Mutters, PA-C  amphetamine-dextroamphetamine (ADDERALL) 30 MG tablet Take 15 mg by mouth 2 (two) times daily.   Yes Historical Provider, MD  buPROPion (WELLBUTRIN XL) 300 MG 24 hr tablet Take 1 tablet (300 mg total) by mouth daily. 05/31/13  Yes Vicie Mutters, PA-C  lisinopril-hydrochlorothiazide (PRINZIDE,ZESTORETIC) 20-25 MG per tablet Take 1 tablet by mouth daily.   Yes Historical Provider, MD  MILK THISTLE PO Take 1 tablet by mouth daily.   Yes Historical Provider, MD  Multiple Vitamin (MULTIVITAMIN WITH MINERALS) TABS tablet Take 1 tablet by mouth daily.   Yes Historical Provider, MD  Probiotic Product (PROBIOTIC DAILY PO) Take 1 tablet by mouth daily.   Yes Historical Provider, MD  ALPRAZolam Duanne Moron) 0.5 MG tablet TAKE 1 TABLET TWICE A DAY AS NEEDED FOR ANXIETY 05/26/13   Melissa R Smith, PA-C   BP 108/69  Pulse 100  Temp(Src) 99 F (37.2 C) (Oral)  Resp 20  Ht 5' 2"  (1.575 m)  Wt 115 lb (52.164 kg)  BMI 21.03 kg/m2  SpO2 100% Vitals reviewed Physical Exam Physical Examination: General appearance - alert, well appearing, and in no distress  Mental status - alert, oriented to person, place, and time Eyes - no conjunctival injection, no scleral icterus Mouth - mucous membranes moist, pharynx normal without lesions Chest - clear to auscultation, no wheezes, rales or rhonchi, symmetric air entry Heart - normal rate, regular rhythm, normal S1, S2, no murmurs, rubs, clicks or gallops Abdomen - soft, ttp in left lower abdomen, no gaurding or rebound tenderness , nondistended, no masses or organomegaly, nabs Extremities - peripheral pulses normal, no pedal edema, no clubbing or cyanosis Skin - normal coloration and turgor  ED Course  Procedures (including critical care time) Labs Review Labs Reviewed  CBC WITH  DIFFERENTIAL - Abnormal; Notable for the following:    WBC 14.4 (*)    Neutrophils Relative % 86 (*)    Neutro Abs 12.4 (*)    Lymphocytes Relative 5 (*)    Monocytes Absolute 1.3 (*)    All other components within normal limits  COMPREHENSIVE METABOLIC PANEL - Abnormal; Notable for the following:    Glucose, Bld 129 (*)    AST 57 (*)    ALT 92 (*)    Alkaline Phosphatase 135 (*)    All other components within normal limits  URINALYSIS, ROUTINE W REFLEX MICROSCOPIC - Abnormal; Notable for the following:    Color, Urine AMBER (*)    Hgb urine dipstick SMALL (*)    Bilirubin Urine SMALL (*)    Ketones, ur 15 (*)    All other components within normal limits  APTT - Abnormal; Notable for the following:    aPTT 23 (*)    All other components within normal limits  URINE MICROSCOPIC-ADD ON - Abnormal; Notable for the following:    Bacteria, UA FEW (*)    Crystals CA OXALATE CRYSTALS (*)    All other components within normal limits  BASIC METABOLIC PANEL - Abnormal; Notable for the following:    Sodium 136 (*)    Glucose, Bld 132 (*)    All other components within normal limits  CBC - Abnormal; Notable for the following:    WBC 14.8 (*)    All other components within normal limits  POC OCCULT BLOOD, ED - Abnormal; Notable for the following:    Fecal Occult Bld POSITIVE (*)    All other components within normal limits  CLOSTRIDIUM DIFFICILE BY PCR  LIPASE, BLOOD  PROTIME-INR  HEMOGLOBIN  HEMOGLOBIN  HEMOGLOBIN  FECAL LACTOFERRIN  HEMOGLOBIN  BASIC METABOLIC PANEL  CBC  POC OCCULT BLOOD, ED  TYPE AND SCREEN  ABO/RH  PREPARE RBC (CROSSMATCH)    Imaging Review Ct Abdomen Pelvis W Contrast  08/05/2013   CLINICAL DATA:  Bloody stools and abdominal pain with new diagnosis of hepatitis-C.  EXAM: CT ABDOMEN AND PELVIS WITH CONTRAST  TECHNIQUE: Multidetector CT imaging of the abdomen and pelvis was performed using the standard protocol following bolus administration of intravenous  contrast.  CONTRAST:  157m OMNIPAQUE IOHEXOL 300 MG/ML SOLN intravenously semi: The patient also received oral contrast material.  COMPARISON:  Abdominal CT scan of October 25, 2010  FINDINGS: There is long segment thickening of the wall of the descending colon and proximal rectosigmoid. A few diverticula are visible. There is no obstruction to passage of the oral contrast which has reached the rectum. More proximally the colon and small bowel are normal. There is a normal appendix and terminal ileum.  The liver exhibits mildly decreased density consistent with fatty infiltration. There is no focal mass or ductal dilation. The gallbladder, pancreas,  spleen, adrenal glands, and kidneys are normal. The abdominal aorta is normal in caliber. The uterus and adnexal structures are normal for age. Tubal ligation clips are present bilaterally. The urinary bladder is normal.  There is disc space narrowing at L5-S1. The bony pelvis is unremarkable. The lung bases are clear.  IMPRESSION: 1. There is diffuse mural thickening of the descending colon and proximal rectosigmoid consistent with colitis. There is no evidence of obstruction, perforation, or abscess formation. 2. There is no acute abnormality elsewhere within the abdomen or pelvis.   Electronically Signed   By: David  Martinique   On: 08/05/2013 20:50     EKG Interpretation None      MDM   Final diagnoses:  Colitis  Hyperlipidemia  Essential hypertension  Lower GI bleed    Pt presenting with c/o lower abdominal cramping and passing bright red blood per rectum.  Pt treated with IV hydration and zofran.  CT scan shows colitis.  Pt is not anemic and is HD stable.  Pt started on cipro and flagyl IV- [facility] and trending hemoglobins.  Pt updated about findings and plan.      Threasa Beards, MD 08/06/13 1800

## 2013-08-06 NOTE — Progress Notes (Signed)
[facility] PROGRESS NOTE  [name]:[id number] DOB: May 27, 1955 DOA: 08/05/2013 PCP: Alesia Richards, MD  Assessment/Plan: 1. Lower GI Bleed -Patient complaining of bloody stools over the past 24 hours -Denies recent travel, sick contacts or eating undercoked meats -Had colonoscopy 5 years ago that was normal -Hemoglobin stable, CT of abdomen showed diffuse thickening of descending colon consistent with colitis.  -Case discussed with Dr Henrene Pastor of GI, likely infectious, recommending supportive care  -On Cipro/Flagyl  Code Status: Full Code Family Communication: Spoke with her husband present at bedside Disposition Plan: Continue supportive care, anticipate discharge in the next 24-48 hours   Consultants:  Telephone consult made to Dr Henrene Pastor of GI  Antibiotics:  Cipro  Flagyl  HPI/Subjective: Patient with no significant past medical history presenting with complaints of blood stools over the past 24 hours. She denies devere abdominal pain this morning however did have an episode bloody stools this morning    Objective: Filed Vitals:   08/06/13 1010  BP: 125/79  Pulse:   Temp:   Resp:     Intake/Output Summary (Last 24 hours) at 08/06/13 1200 Last data filed at 08/06/13 1123  Gross per 24 hour  Intake 1255.83 ml  Output    500 ml  Net 755.83 ml   Filed Weights   08/05/13 1610 08/05/13 2226  Weight: 52.164 kg (115 lb) 52.164 kg (115 lb)    Exam:   General:  Nontoxic appearing. No acute distress  Cardiovascular: RRR NL S1S2  Respiratory: Clear to auscultation, normal inspiratory effort  Abdomen: Soft, nontender, nondistended  Musculoskeletal: No edema  Data Reviewed: Basic Metabolic Panel:  Recent Labs Lab 08/05/13 1635 08/06/13 0450  NA 139 136*  K 4.4 4.2  CL 100 97  CO2 26 27  GLUCOSE 129* 132*  BUN 15 12  CREATININE 0.69 0.74  CALCIUM 10.4 9.4   Liver Function Tests:  Recent Labs Lab 08/05/13 1635  AST 57*  ALT 92*   ALKPHOS 135*  BILITOT 0.6  PROT 8.1  ALBUMIN 4.5    Recent Labs Lab 08/05/13 1635  LIPASE 26   No results found for this basename: AMMONIA,  in the last 168 hours CBC:  Recent Labs Lab 08/05/13 1635 08/05/13 2325 08/06/13 0450 08/06/13 1012  WBC 14.4*  --  14.8*  --   NEUTROABS 12.4*  --   --   --   HGB 14.7 13.4 13.5 12.7  HCT 42.2  --  38.9  --   MCV 89.4  --  91.1  --   PLT 259  --  226  --    Cardiac Enzymes: No results found for this basename: CKTOTAL, CKMB, CKMBINDEX, TROPONINI,  in the last 168 hours BNP (last 3 results) No results found for this basename: PROBNP,  in the last 8760 hours CBG: No results found for this basename: GLUCAP,  in the last 168 hours  Recent Results (from the past 240 hour(s))  CLOSTRIDIUM DIFFICILE BY PCR     Status: None   Collection Time    08/06/13  1:39 AM      Result Value Ref Range Status   C difficile by pcr NEGATIVE  NEGATIVE Final   Comment: Performed at [facility]     Studies: Ct Abdomen Pelvis W Contrast  08/05/2013   CLINICAL DATA:  Bloody stools and abdominal pain with new diagnosis of hepatitis-C.  EXAM: CT ABDOMEN AND PELVIS WITH CONTRAST  TECHNIQUE: Multidetector CT imaging of the abdomen and  pelvis was performed using the standard protocol following bolus administration of intravenous contrast.  CONTRAST:  116m OMNIPAQUE IOHEXOL 300 MG/ML SOLN intravenously semi: The patient also received oral contrast material.  COMPARISON:  Abdominal CT scan of October 25, 2010  FINDINGS: There is long segment thickening of the wall of the descending colon and proximal rectosigmoid. A few diverticula are visible. There is no obstruction to passage of the oral contrast which has reached the rectum. More proximally the colon and small bowel are normal. There is a normal appendix and terminal ileum.  The liver exhibits mildly decreased density consistent with fatty infiltration. There is no focal mass or ductal dilation. The  gallbladder, pancreas, spleen, adrenal glands, and kidneys are normal. The abdominal aorta is normal in caliber. The uterus and adnexal structures are normal for age. Tubal ligation clips are present bilaterally. The urinary bladder is normal.  There is disc space narrowing at L5-S1. The bony pelvis is unremarkable. The lung bases are clear.  IMPRESSION: 1. There is diffuse mural thickening of the descending colon and proximal rectosigmoid consistent with colitis. There is no evidence of obstruction, perforation, or abscess formation. 2. There is no acute abnormality elsewhere within the abdomen or pelvis.   Electronically Signed   By: David  JMartinique  On: 08/05/2013 20:50    Scheduled Meds: . ALPRAZolam  0.5 mg Oral BID  . amphetamine-dextroamphetamine  15 mg Oral BID WC  . buPROPion  300 mg Oral Daily  . ciprofloxacin  400 mg Intravenous Q12H  . diphenhydrAMINE  25 mg Oral Once  . furosemide  20 mg Intravenous Once  . lisinopril  20 mg Oral Daily   And  . hydrochlorothiazide  25 mg Oral Daily  . metronidazole  500 mg Intravenous Q8H  . multivitamin with minerals  1 tablet Oral Daily  . sodium chloride  3 mL Intravenous Q12H   Continuous Infusions: . sodium chloride 50 mL/hr at 08/05/13 2251    Active Problems:   Colitis    Time spent: 25 min    ZKelvin Cellar [facility] Pager [phone] If 7PM-7AM, please contact night-coverage at [email or web address], password [email or web address]/05/2013, 12:00 PM  LOS: 1 day

## 2013-08-06 NOTE — Progress Notes (Signed)
Pt transported from ED via bed and ambulated to bed with little assist. VSS at this time. Husband at bedside. No distress noted. Will continue to monitor pt closely. Carnella [place] I

## 2013-08-07 LAB — CBC
HCT: 35.8 % — ABNORMAL LOW (ref 36.0–46.0)
Hemoglobin: 12 g/dL (ref 12.0–15.0)
MCH: 31.1 pg (ref 26.0–34.0)
MCHC: 33.5 g/dL (ref 30.0–36.0)
MCV: 92.7 fL (ref 78.0–100.0)
Platelets: 197 10*3/uL (ref 150–400)
RBC: 3.86 MIL/uL — ABNORMAL LOW (ref 3.87–5.11)
RDW: 12.7 % (ref 11.5–15.5)
WBC: 13.2 10*3/uL — AB (ref 4.0–10.5)

## 2013-08-07 LAB — BASIC METABOLIC PANEL
ANION GAP: 12 (ref 5–15)
BUN: 6 mg/dL (ref 6–23)
CO2: 26 mEq/L (ref 19–32)
Calcium: 8.8 mg/dL (ref 8.4–10.5)
Chloride: 100 mEq/L (ref 96–112)
Creatinine, Ser: 0.71 mg/dL (ref 0.50–1.10)
GFR calc Af Amer: >90 mL/min (ref >90)
Glucose, Bld: 113 mg/dL — ABNORMAL HIGH (ref 70–99)
Potassium: 3.8 mEq/L (ref 3.7–5.3)
SODIUM: 138 meq/L (ref 137–147)

## 2013-08-07 LAB — FECAL LACTOFERRIN, QUANT: Fecal Lactoferrin: NEGATIVE

## 2013-08-07 LAB — HEMOGLOBIN: Hemoglobin: 12.1 g/dL (ref 12.0–15.0)

## 2013-08-07 MED ORDER — FLUCONAZOLE 100 MG PO TABS: 100.0000 mg | ORAL_TABLET | Freq: Every day | ORAL | Status: DC

## 2013-08-07 MED ORDER — OXYCODONE HCL 5 MG PO TABS: 5.0000 mg | ORAL_TABLET | Freq: Four times a day (QID) | ORAL | Status: DC | PRN

## 2013-08-07 MED ORDER — ONDANSETRON 4 MG PO TBDP: 4.0000 mg | ORAL_TABLET | Freq: Three times a day (TID) | ORAL | Status: DC | PRN

## 2013-08-07 MED ORDER — METRONIDAZOLE 500 MG PO TABS: 500.0000 mg | ORAL_TABLET | Freq: Three times a day (TID) | ORAL | Status: DC

## 2013-08-07 MED ORDER — CIPROFLOXACIN HCL 500 MG PO TABS: 500.0000 mg | ORAL_TABLET | Freq: Two times a day (BID) | ORAL | Status: DC

## 2013-08-07 NOTE — Discharge Summary (Signed)
Physician Discharge Summary  [name] WEX:[id number] DOB: 1955/12/27 DOA: 08/05/2013  PCP: Alesia Richards, MD  Admit date: 08/05/2013 Discharge date: 08/07/2013  Time spent: 35 minutes  Recommendations for Outpatient Follow-up:  1. Please follow up on patient's colitis  Discharge Diagnoses:  Active Problems:   Colitis   Discharge Condition: Stable  Diet recommendation: Regular Diet  Filed Weights   08/05/13 1610 08/05/13 2226  Weight: 52.164 kg (115 lb) 52.164 kg (115 lb)    History of present illness:  [name] is a 59 y.o. female, with past medical history significant for hepatitis C and hypertension presenting today with one-day history of lower crampy abdominal pain associated with nausea vomiting and bright red per rectum. This continued with episodes since 3 AM when she woke up with that. No history of antibiotics lately. No history of fever or chills. Patient denies any history of colitis or Crohn's disease    Hospital Course:  Patient with no significant past medical history presenting with complaints of blood stools over the past 24 hours prior to admission. She denied severe abdominal pain. She had not reported recent travel, sick contacts or eating undercooked meats. She was workup further with an abdominal CT which showed diffuse mural thickening of the descending colon and proximal rectosigmoid consistent with colitis. Her colitis was felt to be of infectious origin as she was started on emperic Cipro and Flagyl. She reported having a colonoscopy 5 years ago which was negative.  By 08/07/2013 she reported feeling much better. Her bloody stools had resolved as she was tolerating a regular diet and felt that she could be able to manage at home. She was discharged in stable condition on 08/07/2013.   Consultations:  Telephone consult with Dr Henrene Pastor of GI  Discharge Exam: Filed Vitals:   08/07/13 0945  BP: 119/76  Pulse:   Temp:   Resp:    General: Nontoxic  appearing. No acute distress  Cardiovascular: RRR NL S1S2  Respiratory: Clear to auscultation, normal inspiratory effort  Abdomen: Soft, nontender, nondistended  Musculoskeletal: No edema   Discharge Instructions You were cared for by a hospitalist during your hospital stay. If you have any questions about your discharge medications or the care you received while you were in the hospital after you are discharged, you can call the unit and asked to speak with the hospitalist on call if the hospitalist that took care of you is not available. Once you are discharged, your primary care physician will handle any further medical issues. Please note that NO REFILLS for any discharge medications will be authorized once you are discharged, as it is imperative that you return to your primary care physician (or establish a relationship with a primary care physician if you do not have one) for your aftercare needs so that they can reassess your need for medications and monitor your lab values.  Discharge Instructions   Call MD for:  difficulty breathing, headache or visual disturbances    Complete by:  As directed      Call MD for:  extreme fatigue    Complete by:  As directed      Call MD for:  persistant dizziness or light-headedness    Complete by:  As directed      Call MD for:  persistant nausea and vomiting    Complete by:  As directed      Call MD for:  redness, tenderness, or signs of infection (pain, swelling, redness, odor or green/yellow discharge  around incision site)    Complete by:  As directed      Call MD for:  temperature >100.4    Complete by:  As directed      Diet - low sodium heart healthy    Complete by:  As directed      Increase activity slowly    Complete by:  As directed             Medication List         ALPRAZolam 0.5 MG tablet  Commonly known as:  XANAX  TAKE 1 TABLET TWICE A DAY AS NEEDED FOR ANXIETY     amphetamine-dextroamphetamine 30 MG tablet  Commonly  known as:  ADDERALL  Take 15 mg by mouth 2 (two) times daily.     amphetamine-dextroamphetamine 30 MG tablet  Commonly known as:  ADDERALL  Take 1 tablet (30 mg total) by mouth 2 (two) times daily.     buPROPion 300 MG 24 hr tablet  Commonly known as:  WELLBUTRIN XL  Take 1 tablet (300 mg total) by mouth daily.     ciprofloxacin 500 MG tablet  Commonly known as:  CIPRO  Take 1 tablet (500 mg total) by mouth 2 (two) times daily.     fluconazole 100 MG tablet  Commonly known as:  DIFLUCAN  Take 1 tablet (100 mg total) by mouth daily.     lisinopril-hydrochlorothiazide 20-25 MG per tablet  Commonly known as:  PRINZIDE,ZESTORETIC  Take 1 tablet by mouth daily.     metroNIDAZOLE 500 MG tablet  Commonly known as:  FLAGYL  Take 1 tablet (500 mg total) by mouth 3 (three) times daily.     MILK THISTLE PO  Take 1 tablet by mouth daily.     multivitamin with minerals Tabs tablet  Take 1 tablet by mouth daily.     ondansetron 4 MG disintegrating tablet  Commonly known as:  ZOFRAN ODT  Take 1 tablet (4 mg total) by mouth every 8 (eight) hours as needed for nausea or vomiting.     oxyCODONE 5 MG immediate release tablet  Commonly known as:  Oxy IR/ROXICODONE  Take 1 tablet (5 mg total) by mouth every 6 (six) hours as needed for moderate pain.     PROBIOTIC DAILY PO  Take 1 tablet by mouth daily.       No Known Allergies     Follow-up Information   Follow up with MCKEOWN,WILLIAM DAVID, MD In 1 week.   Specialty:  Internal Medicine   Contact information:   [address] [place] [address] [phone]        The results of significant diagnostics from this hospitalization (including imaging, microbiology, ancillary and laboratory) are listed below for reference.    Significant Diagnostic Studies: Dg Abd 1 View  08/06/2013   CLINICAL DATA:  Abdominal distention, pain, nausea.  EXAM: ABDOMEN - 1 VIEW  COMPARISON:  CT 08/05/2013  FINDINGS: Gas and  contrast material throughout nondistended large and small bowel loops. The left colonic wall thickening seen on prior CT cannot be appreciated by plain film. No free air organomegaly. Prior bilateral tubal ligation. No acute bony abnormality.  IMPRESSION: Negative.   Electronically Signed   By: Rolm Baptise M.D.   On: 08/06/2013 19:25   Ct Abdomen Pelvis W Contrast  08/05/2013   CLINICAL DATA:  Bloody stools and abdominal pain with new diagnosis of hepatitis-C.  EXAM: CT ABDOMEN AND PELVIS WITH CONTRAST  TECHNIQUE:  Multidetector CT imaging of the abdomen and pelvis was performed using the standard protocol following bolus administration of intravenous contrast.  CONTRAST:  165m OMNIPAQUE IOHEXOL 300 MG/ML SOLN intravenously semi: The patient also received oral contrast material.  COMPARISON:  Abdominal CT scan of October 25, 2010  FINDINGS: There is long segment thickening of the wall of the descending colon and proximal rectosigmoid. A few diverticula are visible. There is no obstruction to passage of the oral contrast which has reached the rectum. More proximally the colon and small bowel are normal. There is a normal appendix and terminal ileum.  The liver exhibits mildly decreased density consistent with fatty infiltration. There is no focal mass or ductal dilation. The gallbladder, pancreas, spleen, adrenal glands, and kidneys are normal. The abdominal aorta is normal in caliber. The uterus and adnexal structures are normal for age. Tubal ligation clips are present bilaterally. The urinary bladder is normal.  There is disc space narrowing at L5-S1. The bony pelvis is unremarkable. The lung bases are clear.  IMPRESSION: 1. There is diffuse mural thickening of the descending colon and proximal rectosigmoid consistent with colitis. There is no evidence of obstruction, perforation, or abscess formation. 2. There is no acute abnormality elsewhere within the abdomen or pelvis.   Electronically Signed   By: David   JMartinique  On: 08/05/2013 20:50    Microbiology: Recent Results (from the past 240 hour(s))  CLOSTRIDIUM DIFFICILE BY PCR     Status: None   Collection Time    08/06/13  1:39 AM      Result Value Ref Range Status   C difficile by pcr NEGATIVE  NEGATIVE Final   Comment: Performed at [facility] Basic Metabolic Panel:  Recent Labs Lab 08/05/13 1635 08/06/13 0450 08/07/13 0421  NA 139 136* 138  K 4.4 4.2 3.8  CL 100 97 100  CO2 26 27 26   GLUCOSE 129* 132* 113*  BUN 15 12 6   CREATININE 0.69 0.74 0.71  CALCIUM 10.4 9.4 8.8   Liver Function Tests:  Recent Labs Lab 08/05/13 1635  AST 57*  ALT 92*  ALKPHOS 135*  BILITOT 0.6  PROT 8.1  ALBUMIN 4.5    Recent Labs Lab 08/05/13 1635  LIPASE 26   No results found for this basename: AMMONIA,  in the last 168 hours CBC:  Recent Labs Lab 08/05/13 1635  08/06/13 0450 08/06/13 1012 08/06/13 1644 08/06/13 2214 08/07/13 0421 08/07/13 1027  WBC 14.4*  --  14.8*  --   --   --  13.2*  --   NEUTROABS 12.4*  --   --   --   --   --   --   --   HGB 14.7  < > 13.5 12.7 12.6 12.5 12.0 12.1  HCT 42.2  --  38.9  --   --   --  35.8*  --   MCV 89.4  --  91.1  --   --   --  92.7  --   PLT 259  --  226  --   --   --  197  --   < > = values in this interval not displayed. Cardiac Enzymes: No results found for this basename: CKTOTAL, CKMB, CKMBINDEX, TROPONINI,  in the last 168 hours BNP: BNP (last 3 results) No results found for this basename: PROBNP,  in the last 8760 hours CBG: No results found for this basename: GLUCAP,  in the last  168 hours     Signed:  Kelvin Cellar  [facility] 08/07/2013, 11:43 AM

## 2013-08-07 NOTE — Progress Notes (Signed)
Patient tolerated full liq diet well. Stated she is ready to go home, did not have any N/V.

## 2013-08-07 NOTE — Progress Notes (Signed)
Patient discharged home with husband. Discharge instructions given and explained to patient/husband and they verbalized understanding. Patient denies any pain/distress. Skin intact, no wound noted. Accompanied home by husband. Transported to the car by staff  Via wheelchair.

## 2013-08-08 LAB — GI PATHOGEN PANEL BY PCR, STOOL
C DIFFICILE TOXIN A/B: NEGATIVE
Campylobacter by PCR: NEGATIVE
Cryptosporidium by PCR: NEGATIVE
E COLI 0157 BY PCR: NEGATIVE
E coli (ETEC) LT/ST: NEGATIVE
E coli (STEC): NEGATIVE
G LAMBLIA BY PCR: NEGATIVE
Norovirus GI/GII: NEGATIVE
Rotavirus A by PCR: NEGATIVE
SHIGELLA BY PCR: NEGATIVE
Salmonella by PCR: NEGATIVE

## 2013-08-09 LAB — TYPE AND SCREEN
ABO/RH(D): B POS
Antibody Screen: NEGATIVE
Unit division: 0
Unit division: 0

## 2013-08-10 ENCOUNTER — Other Ambulatory Visit: Payer: Self-pay | Admitting: Emergency Medicine

## 2013-08-11 ENCOUNTER — Other Ambulatory Visit: Payer: Self-pay | Admitting: Physician Assistant

## 2013-08-12 ENCOUNTER — Other Ambulatory Visit: Payer: Self-pay

## 2013-08-12 DIAGNOSIS — Z1231 Encounter for screening mammogram for malignant neoplasm of breast: Secondary | ICD-10-CM

## 2013-08-25 ENCOUNTER — Ambulatory Visit
Admission: RE | Admit: 2013-08-25 | Discharge: 2013-08-25 | Disposition: A | Discharge status: Home / Self Care | Payer: PRIVATE HEALTH INSURANCE | Source: Ambulatory Visit

## 2013-08-25 DIAGNOSIS — Z1231 Encounter for screening mammogram for malignant neoplasm of breast: Secondary | ICD-10-CM

## 2013-09-29 ENCOUNTER — Ambulatory Visit (INDEPENDENT_AMBULATORY_CARE_PROVIDER_SITE_OTHER): Payer: PRIVATE HEALTH INSURANCE | Admitting: Physician Assistant

## 2013-09-29 ENCOUNTER — Encounter: Payer: Self-pay | Admitting: Physician Assistant

## 2013-09-29 VITALS — BP 122/80 | HR 88 | Temp 97.9°F | Resp 16 | Ht 62.0 in | Wt 116.0 lb

## 2013-09-29 DIAGNOSIS — E785 Hyperlipidemia, unspecified: Secondary | ICD-10-CM

## 2013-09-29 DIAGNOSIS — K5289 Other specified noninfective gastroenteritis and colitis: Secondary | ICD-10-CM

## 2013-09-29 DIAGNOSIS — K529 Noninfective gastroenteritis and colitis, unspecified: Secondary | ICD-10-CM

## 2013-09-29 DIAGNOSIS — N952 Postmenopausal atrophic vaginitis: Secondary | ICD-10-CM

## 2013-09-29 DIAGNOSIS — I10 Essential (primary) hypertension: Secondary | ICD-10-CM

## 2013-09-29 DIAGNOSIS — F341 Dysthymic disorder: Secondary | ICD-10-CM

## 2013-09-29 DIAGNOSIS — F418 Other specified anxiety disorders: Secondary | ICD-10-CM

## 2013-09-29 DIAGNOSIS — M899 Disorder of bone, unspecified: Secondary | ICD-10-CM

## 2013-09-29 DIAGNOSIS — K7581 Nonalcoholic steatohepatitis (NASH): Secondary | ICD-10-CM | POA: Insufficient documentation

## 2013-09-29 DIAGNOSIS — B1920 Unspecified viral hepatitis C without hepatic coma: Secondary | ICD-10-CM

## 2013-09-29 DIAGNOSIS — C801 Malignant (primary) neoplasm, unspecified: Secondary | ICD-10-CM

## 2013-09-29 DIAGNOSIS — M949 Disorder of cartilage, unspecified: Secondary | ICD-10-CM

## 2013-09-29 DIAGNOSIS — M8580 Other specified disorders of bone density and structure, unspecified site: Secondary | ICD-10-CM

## 2013-09-29 DIAGNOSIS — K7689 Other specified diseases of liver: Secondary | ICD-10-CM

## 2013-09-29 DIAGNOSIS — Z0000 Encounter for general adult medical examination without abnormal findings: Secondary | ICD-10-CM

## 2013-09-29 LAB — CBC WITH DIFFERENTIAL/PLATELET
BASOS ABS: 0 10*3/uL (ref 0.0–0.1)
Basophils Relative: 1 % (ref 0–1)
EOS ABS: 0.1 10*3/uL (ref 0.0–0.7)
Eosinophils Relative: 2 % (ref 0–5)
HCT: 41.8 % (ref 36.0–46.0)
Hemoglobin: 14.8 g/dL (ref 12.0–15.0)
LYMPHS ABS: 1.2 10*3/uL (ref 0.7–4.0)
Lymphocytes Relative: 26 % (ref 12–46)
MCH: 31.9 pg (ref 26.0–34.0)
MCHC: 35.4 g/dL (ref 30.0–36.0)
MCV: 90.1 fL (ref 78.0–100.0)
Monocytes Absolute: 0.6 10*3/uL (ref 0.1–1.0)
Monocytes Relative: 12 % (ref 3–12)
NEUTROS PCT: 59 % (ref 43–77)
Neutro Abs: 2.8 10*3/uL (ref 1.7–7.7)
PLATELETS: 310 10*3/uL (ref 150–400)
RBC: 4.64 MIL/uL (ref 3.87–5.11)
RDW: 13.1 % (ref 11.5–15.5)
WBC: 4.8 10*3/uL (ref 4.0–10.5)

## 2013-09-29 LAB — HEMOGLOBIN A1C
HEMOGLOBIN A1C: 5.3 % (ref <5.7)
MEAN PLASMA GLUCOSE: 105 mg/dL (ref <117)

## 2013-09-29 MED ORDER — OSPEMIFENE 60 MG PO TABS: ORAL_TABLET | ORAL | Status: DC

## 2013-09-29 MED ORDER — ESCITALOPRAM OXALATE 20 MG PO TABS: ORAL_TABLET | ORAL | Status: DC

## 2013-09-29 MED ORDER — BUPROPION HCL ER (XL) 300 MG PO TB24: 300.0000 mg | ORAL_TABLET | Freq: Every day | ORAL | Status: DC

## 2013-09-29 MED ORDER — ALPRAZOLAM 0.5 MG PO TABS: ORAL_TABLET | ORAL | Status: DC

## 2013-09-29 MED ORDER — AMPHETAMINE-DEXTROAMPHETAMINE 30 MG PO TABS: 30.0000 mg | ORAL_TABLET | Freq: Two times a day (BID) | ORAL | Status: DC

## 2013-09-29 NOTE — Progress Notes (Signed)
Complete Physical  Assessment and Plan: 1. Essential hypertension - continue medications, DASH diet, exercise and monitor at home. Call if greater than 130/80.   2. Hyperlipidemia -continue medications, check lipids, decrease fatty foods, increase activity.   3. Hepatitis C virus infection without hepatic coma, unspecified chronicity Follow up with GI  4. NASH (nonalcoholic steatohepatitis) Weight loss advised  5. Cancer breast Continue MGM  6. Osteopenia - DG Bone Density; Future  7. Colitis- neg Cdiff Improved, had colonoscopy, will monitor  8. Vaginal atrophy Long discussion, she would like to try osphena- long discussion about how it was never tested in patient with history of breast cancer and it is unknown if hers was estrogen/progesterone sensitive, she understands risk but wishes to proceed with treatment.   9. Depression with anxiety Add on lexapro 48m and decrease wellbutrin to 150xl daily  10. Routine general medical examination at a health care facility - CBC with Differential - BASIC METABOLIC PANEL WITH GFR - Lipid panel - Hepatic function panel - TSH - Hemoglobin A1c - Insulin, fasting - Vit D  25 hydroxy (rtn osteoporosis monitoring) - Urinalysis, Routine w reflex microscopic - Microalbumin / creatinine urine ratio - Vitamin B12 - Magnesium - Iron and TIBC - Ferritin   Discussed med's effects and SE's. Screening labs and tests as requested with regular follow-up as recommended.  HPI 58y.o. female  presents for a complete physical.  Her blood pressure has been controlled at home, today their BP is BP: 122/80 mmHg She does workout. She denies chest pain, shortness of breath, dizziness.  She is not on cholesterol medication and denies myalgias. Her cholesterol is at goal. The cholesterol last visit was:   Lab Results  Component Value Date   CHOL 236* 05/31/2013   HDL 131 05/31/2013   LDLCALC 89 05/31/2013   TRIG 80 05/31/2013   CHOLHDL 1.8  05/31/2013   Patient is on Vitamin D supplement.   Lab Results  Component Value Date   VD25OH 7874/28/2015     She has a history of hepatitis C, has not had treatment yet, trying to get approved for new medication with her insurance.  She most recently was admitted to the hospital from 08/05/13-08/08/11 for infectious colitis, treated with Cipro/Flaygl. Her LFTs and Alk phos were elevated in the hospital, she does have NASH via CT AB, we will repeat those labs today.  Patient is on an ADD medication, she states that the medication is helping and she denies any adverse reactions.  She has a history of breast cancer, normal MGM in July.  She complains of vaginal dryness and states that is is affected her sex life, she would like to try osphena.   She is on wellbutrin and xanax for anxiety/depression. She has been on the wellbutrin since her sister was diagnosed with cancer but recently she has been feeling very depressed, crying often.  Marries with 1 son, works at [employer]   Current Medications:  Current Outpatient Prescriptions on File Prior to Visit  Medication Sig Dispense Refill  . ALPRAZolam (XANAX) 0.5 MG tablet TAKE 1 TABLET TWICE A DAY  60 tablet  1  . amphetamine-dextroamphetamine (ADDERALL) 30 MG tablet Take 1 tablet (30 mg total) by mouth 2 (two) times daily.  60 tablet  0  . buPROPion (WELLBUTRIN XL) 300 MG 24 hr tablet Take 1 tablet (300 mg total) by mouth daily.  90 tablet  1  . lisinopril-hydrochlorothiazide (PRINZIDE,ZESTORETIC) 20-25 MG per tablet TAKE ONE  TABLET BY MOUTH ONE TIME DAILY   90 tablet  0  . MILK THISTLE PO Take 1 tablet by mouth daily.      . Multiple Vitamin (MULTIVITAMIN WITH MINERALS) TABS tablet Take 1 tablet by mouth daily.      Marland Kitchen oxyCODONE (OXY IR/ROXICODONE) 5 MG immediate release tablet Take 1 tablet (5 mg total) by mouth every 6 (six) hours as needed for moderate pain.  15 tablet  0  . Probiotic Product (PROBIOTIC DAILY PO) Take 1 tablet by mouth daily.        No current facility-administered medications on file prior to visit.   Health Maintenance:   Immunization History  Administered Date(s) Administered  . Tdap 09/19/2008   Tetanus: 2010 Pneumovax: Flu vaccine: 2014 Zostavax: Pap: 2014 history of LEEP in 1994, ASCUS in 2010 sees Dr. Matthew Saras MGM: 08/2013 DEXA: 2012 Colonoscopy: 09/25/2013- with Dr. Alice Reichert  EGD: CTAB 2015: NASH and Colitis Echo 2011: normal EF 60-65%  Allergies: No Known Allergies Medical History:  Past Medical History  Diagnosis Date  . Hyperlipidemia   . Hypertension   . ADD (attention deficit disorder)   . Cancer 2004    Breast   . 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 Leep  1994  . Tubal ligation  2004  . Ovarian cyst removal Left 2004   Family History:  Family History  Problem Relation Age of Onset  . Cancer Mother   . Stroke Father   . Hypertension Father   . Cancer Sister    Social History:  History  Substance Use Topics  . Smoking status: Former Smoker    Types: Cigarettes    Quit date: 02/04/1988  . Smokeless tobacco: Never Used  . Alcohol Use: Yes   Review of Systems: [X]  = complains of  [ ]  = denies  General: Fatigue [ ]  Fever [ ]  Chills [ ]  Weakness [ ]   Insomnia [ ] Weight change [ ]  Night sweats [ ]   Change in appetite [ ]  Eyes: Redness [ ]  Blurred vision [ ]  Diplopia [ ]  Discharge [ ]   ENT: Congestion [ ]  Sinus Pain [ ]  Post Nasal Drip [ ]  Sore Throat [ ]  Earache [ ]  hearing loss [ ]  Tinnitus [ ]  Snoring [ ]   Cardiac: Chest pain/pressure [ ]  SOB [ ]  Orthopnea [ ]   Palpitations [ ]   Paroxysmal nocturnal dyspnea[ ]  Claudication [ ]  Edema [ ]   Pulmonary: Cough [ ]  Wheezing[ ]   SOB [ ]   Pleurisy [ ]   GI: Nausea [ ]  Vomiting[ ]  Dysphagia[ ]  Heartburn[ ]  Abdominal pain [ ]  Constipation [ ] ; Diarrhea [ ]  BRBPR [ ]  Melena[ ]  Bloating [ ]  Hemorrhoids [ ]   GU: Hematuria[ ]  Dysuria [ ]   Nocturia[ ]  Urgency [ ]   Hesitancy [ ]  Discharge [ ]  Frequency [ ]   Breast:  Breast lumps [ ]   nipple discharge [ ]    Neuro: Headaches[ ]  Vertigo[ ]  Paresthesias[ ]  Spasm [ ]  Speech changes [ ]  Incoordination [ ]   Ortho: Arthritis [ ]  Joint pain [ ]  Muscle pain [ ]  Joint swelling [ ]  Back Pain [ ]  Skin:  Rash [ ]   Pruritis [ ]  Change in skin lesion [ ]   Psych: Depression[ ]  Anxiety[ ]  Confusion [ ]  Memory loss [ ]   Heme/Lypmh: Bleeding [ ]  Bruising [ ]  Enlarged lymph nodes [ ]   Endocrine: Visual blurring [ ]  Paresthesia [ ]  Polyuria [ ]  Polydypsea [ ]    Heat/cold intolerance [ ]  Hypoglycemia [ ]   Physical Exam: Estimated body mass index is 21.21 kg/(m^2) as calculated from the following:   Height as of this encounter: 5' 2"  (1.575 m).   Weight as of this encounter: 116 lb (52.617 kg). BP 122/80  Pulse 88  Temp(Src) 97.9 F (36.6 C)  Resp 16  Ht 5' 2"  (1.575 m)  Wt 116 lb (52.617 kg)  BMI 21.21 kg/m2 General Appearance: Well nourished, in no apparent distress. Eyes: PERRLA, EOMs, conjunctiva no swelling or erythema, normal fundi and vessels. Sinuses: No Frontal/maxillary tenderness ENT/Mouth: Ext aud canals clear, normal light reflex with TMs without erythema, bulging.  Good dentition. No erythema, swelling, or exudate on post pharynx. Tonsils not swollen or erythematous. Hearing normal.  Neck: Supple, thyroid normal. No bruits Respiratory: Respiratory effort normal, BS equal bilaterally without rales, rhonchi, wheezing or stridor. Cardio: RRR without murmurs, rubs or gallops. Brisk peripheral pulses without edema.  Chest: symmetric, with normal excursions and percussion. Breasts: defer Abdomen: Soft, +BS. Non tender, no guarding, rebound, hernias, masses, or organomegaly. .  Lymphatics: Non tender without lymphadenopathy.  Genitourinary: defer- will get next year Musculoskeletal: Full ROM all peripheral extremities,5/5 strength, and normal gait. Skin: Warm, dry without rashes,  lesions, ecchymosis.  Neuro: Cranial nerves intact, reflexes equal bilaterally. Normal muscle tone, no cerebellar symptoms. Sensation intact.  Psych: Awake and oriented X 3, normal affect, Insight and Judgment appropriate.    Vicie Mutters 9:29 AM

## 2013-09-29 NOTE — Patient Instructions (Signed)
Take 1/2 of the wellbutrin and can start 1/2 of the lexapro.   Can do the osphena once a day for 1-2 weeks and then every other day.   Preventative Care for Adults - Female      MAINTAIN REGULAR HEALTH EXAMS:  A routine yearly physical is a good way to check in with your primary care provider about your health and preventive screening. It is also an opportunity to share updates about your health and any concerns you have, and receive a thorough all-over exam.   Most health insurance companies pay for at least some preventative services.  Check with your health plan for specific coverages.  WHAT PREVENTATIVE SERVICES DO WOMEN NEED?  Adult women should have their weight and blood pressure checked regularly.   Women age 63 and older should have their cholesterol levels checked regularly.  Women should be screened for cervical cancer with a Pap smear and pelvic exam beginning at either age 64, or 3 years after they become sexually activity.    Breast cancer screening generally begins at age 30 with a mammogram and breast exam by your primary care provider.    Beginning at age 77 and continuing to age 47, women should be screened for colorectal cancer.  Certain people may need continued testing until age 76.  Updating vaccinations is part of preventative care.  Vaccinations help protect against diseases such as the flu.  Osteoporosis is a disease in which the bones lose minerals and strength as we age. Women ages 65 and over should discuss this with their caregivers, as should women after menopause who have other risk factors.  Lab tests are generally done as part of preventative care to screen for anemia and blood disorders, to screen for problems with the kidneys and liver, to screen for bladder problems, to check blood sugar, and to check your cholesterol level.  Preventative services generally include counseling about diet, exercise, avoiding tobacco, drugs, excessive alcohol  consumption, and sexually transmitted infections.    GENERAL RECOMMENDATIONS FOR GOOD HEALTH:  Healthy diet:  Eat a variety of foods, including fruit, vegetables, animal or vegetable protein, such as meat, fish, chicken, and eggs, or beans, lentils, tofu, and grains, such as rice.  Drink plenty of water daily.  Decrease saturated fat in the diet, avoid lots of red meat, processed foods, sweets, fast foods, and fried foods.  Exercise:  Aerobic exercise helps maintain good heart health. At least 30-40 minutes of moderate-intensity exercise is recommended. For example, a brisk walk that increases your heart rate and breathing. This should be done on most days of the week.   Find a type of exercise or a variety of exercises that you enjoy so that it becomes a part of your daily life.  Examples are running, walking, swimming, water aerobics, and biking.  For motivation and support, explore group exercise such as aerobic class, spin class, Zumba, Yoga,or  martial arts, etc.    Set exercise goals for yourself, such as a certain weight goal, walk or run in a race such as a 5k walk/run.  Speak to your primary care provider about exercise goals.  Disease prevention:  If you smoke or chew tobacco, find out from your caregiver how to quit. It can literally save your life, no matter how long you have been a tobacco user. If you do not use tobacco, never begin.   Maintain a healthy diet and normal weight. Increased weight leads to problems with blood pressure and diabetes.  The Body Mass Index or BMI is a way of measuring how much of your body is fat. Having a BMI above 27 increases the risk of heart disease, diabetes, hypertension, stroke and other problems related to obesity. Your caregiver can help determine your BMI and based on it develop an exercise and dietary program to help you achieve or maintain this important measurement at a healthful level.  High blood pressure causes heart and blood  vessel problems.  Persistent high blood pressure should be treated with medicine if weight loss and exercise do not work.   Fat and cholesterol leaves deposits in your arteries that can block them. This causes heart disease and vessel disease elsewhere in your body.  If your cholesterol is found to be high, or if you have heart disease or certain other medical conditions, then you may need to have your cholesterol monitored frequently and be treated with medication.   Ask if you should have a cardiac stress test if your history suggests this. A stress test is a test done on a treadmill that looks for heart disease. This test can find disease prior to there being a problem.  Menopause can be associated with physical symptoms and risks. Hormone replacement therapy is available to decrease these. You should talk to your caregiver about whether starting or continuing to take hormones is right for you.   Osteoporosis is a disease in which the bones lose minerals and strength as we age. This can result in serious bone fractures. Risk of osteoporosis can be identified using a bone density scan. Women ages 47 and over should discuss this with their caregivers, as should women after menopause who have other risk factors. Ask your caregiver whether you should be taking a calcium supplement and Vitamin D, to reduce the rate of osteoporosis.   Avoid drinking alcohol in excess (more than two drinks per day).  Avoid use of street drugs. Do not share needles with anyone. Ask for professional help if you need assistance or instructions on stopping the use of alcohol, cigarettes, and/or drugs.  Brush your teeth twice a day with fluoride toothpaste, and floss once a day. Good oral hygiene prevents tooth decay and gum disease. The problems can be painful, unattractive, and can cause other health problems. Visit your dentist for a routine oral and dental check up and preventive care every 6-12 months.   Look at your skin  regularly.  Use a mirror to look at your back. Notify your caregivers of changes in moles, especially if there are changes in shapes, colors, a size larger than a pencil eraser, an irregular border, or development of new moles.  Safety:  Use seatbelts 100% of the time, whether driving or as a passenger.  Use safety devices such as hearing protection if you work in environments with loud noise or significant background noise.  Use safety glasses when doing any work that could send debris in to the eyes.  Use a helmet if you ride a bike or motorcycle.  Use appropriate safety gear for contact sports.  Talk to your caregiver about gun safety.  Use sunscreen with a SPF (or skin protection factor) of 15 or greater.  Lighter skinned people are at a greater risk of skin cancer. Don't forget to also wear sunglasses in order to protect your eyes from too much damaging sunlight. Damaging sunlight can accelerate cataract formation.   Practice safe sex. Use condoms. Condoms are used for birth control and to help reduce the  spread of sexually transmitted infections (or STIs).  Some of the STIs are gonorrhea (the clap), chlamydia, syphilis, trichomonas, herpes, HPV (human papilloma virus) and HIV (human immunodeficiency virus) which causes AIDS. The herpes, HIV and HPV are viral illnesses that have no cure. These can result in disability, cancer and death.   Keep carbon monoxide and smoke detectors in your home functioning at all times. Change the batteries every 6 months or use a model that plugs into the wall.   Vaccinations:  Stay up to date with your tetanus shots and other required immunizations. You should have a booster for tetanus every 10 years. Be sure to get your flu shot every year, since 5%-20% of the [place] population comes down with the flu. The flu vaccine changes each year, so being vaccinated once is not enough. Get your shot in the fall, before the flu season peaks.   Other vaccines to  consider:  Human Papilloma Virus or HPV causes cancer of the cervix, and other infections that can be transmitted from person to person. There is a vaccine for HPV, and females should get immunized between the ages of 58 and 53. It requires a series of 3 shots.   Pneumococcal vaccine to protect against certain types of pneumonia.  This is normally recommended for adults age 32 or older.  However, adults younger than 57 years old with certain underlying conditions such as diabetes, heart or lung disease should also receive the vaccine.  Shingles vaccine to protect against Varicella Zoster if you are older than age 88, or younger than 57 years old with certain underlying illness.  Hepatitis A vaccine to protect against a form of infection of the liver by a virus acquired from food.  Hepatitis B vaccine to protect against a form of infection of the liver by a virus acquired from blood or body fluids, particularly if you work in health care.  If you plan to travel internationally, check with your local health department for specific vaccination recommendations.  Cancer Screening:  Breast cancer screening is essential to preventive care for women. All women age 53 and older should perform a breast self-exam every month. At age 64 and older, women should have their caregiver complete a breast exam each year. Women at ages 14 and older should have a mammogram (x-ray film) of the breasts. Your caregiver can discuss how often you need mammograms.    Cervical cancer screening includes taking a Pap smear (sample of cells examined under a microscope) from the cervix (end of the uterus). It also includes testing for HPV (Human Papilloma Virus, which can cause cervical cancer). Screening and a pelvic exam should begin at age 83, or 3 years after a woman becomes sexually active. Screening should occur every year, with a Pap smear but no HPV testing, up to age 13. After age 20, you should have a Pap smear every 3  years with HPV testing, if no HPV was found previously.   Most routine colon cancer screening begins at the age of 74. On a yearly basis, doctors may provide special easy to use take-home tests to check for hidden blood in the stool. Sigmoidoscopy or colonoscopy can detect the earliest forms of colon cancer and is life saving. These tests use a small camera at the end of a tube to directly examine the colon. Speak to your caregiver about this at age 69, when routine screening begins (and is repeated every 5 years unless early forms of pre-cancerous polyps or  small growths are found).

## 2013-09-30 LAB — BASIC METABOLIC PANEL WITH GFR
BUN: 13 mg/dL (ref 6–23)
CALCIUM: 10.2 mg/dL (ref 8.4–10.5)
CO2: 30 meq/L (ref 19–32)
Chloride: 93 mEq/L — ABNORMAL LOW (ref 96–112)
Creat: 0.8 mg/dL (ref 0.50–1.10)
GFR, Est Non African American: 82 mL/min
Glucose, Bld: 104 mg/dL — ABNORMAL HIGH (ref 70–99)
Potassium: 4.5 mEq/L (ref 3.5–5.3)
SODIUM: 137 meq/L (ref 135–145)

## 2013-09-30 LAB — LIPID PANEL
Cholesterol: 262 mg/dL — ABNORMAL HIGH (ref 0–200)
HDL: 140 mg/dL (ref >39)
LDL CALC: 113 mg/dL — AB (ref 0–99)
TRIGLYCERIDES: 47 mg/dL (ref <150)
Total CHOL/HDL Ratio: 1.9 Ratio
VLDL: 9 mg/dL (ref 0–40)

## 2013-09-30 LAB — URINALYSIS, ROUTINE W REFLEX MICROSCOPIC
BILIRUBIN URINE: NEGATIVE
GLUCOSE, UA: NEGATIVE mg/dL
HGB URINE DIPSTICK: NEGATIVE
Ketones, ur: NEGATIVE mg/dL
LEUKOCYTES UA: NEGATIVE
Nitrite: NEGATIVE
PROTEIN: NEGATIVE mg/dL
Specific Gravity, Urine: 1.018 (ref 1.005–1.030)
Urobilinogen, UA: 0.2 mg/dL (ref 0.0–1.0)
pH: 7.5 (ref 5.0–8.0)

## 2013-09-30 LAB — INSULIN, FASTING: Insulin fasting, serum: 6.9 u[IU]/mL (ref 2.0–19.6)

## 2013-09-30 LAB — HEPATIC FUNCTION PANEL
ALT: 72 U/L — ABNORMAL HIGH (ref 0–35)
AST: 62 U/L — ABNORMAL HIGH (ref 0–37)
Albumin: 4.8 g/dL (ref 3.5–5.2)
Alkaline Phosphatase: 133 U/L — ABNORMAL HIGH (ref 39–117)
Bilirubin, Direct: 0.2 mg/dL (ref 0.0–0.3)
Indirect Bilirubin: 0.7 mg/dL (ref 0.2–1.2)
Total Bilirubin: 0.9 mg/dL (ref 0.2–1.2)
Total Protein: 8 g/dL (ref 6.0–8.3)

## 2013-09-30 LAB — TSH: TSH: 0.972 u[IU]/mL (ref 0.350–4.500)

## 2013-09-30 LAB — MAGNESIUM: Magnesium: 2 mg/dL (ref 1.5–2.5)

## 2013-09-30 LAB — MICROALBUMIN / CREATININE URINE RATIO
Creatinine, Urine: 204.9 mg/dL
Microalb Creat Ratio: 4 mg/g (ref 0.0–30.0)
Microalb, Ur: 0.81 mg/dL (ref 0.00–1.89)

## 2013-09-30 LAB — IRON AND TIBC
%SAT: 42 % (ref 20–55)
IRON: 159 ug/dL — AB (ref 42–145)
TIBC: 383 ug/dL (ref 250–470)
UIBC: 224 ug/dL (ref 125–400)

## 2013-09-30 LAB — VITAMIN B12: Vitamin B-12: 1124 pg/mL — ABNORMAL HIGH (ref 211–911)

## 2013-09-30 LAB — VITAMIN D 25 HYDROXY (VIT D DEFICIENCY, FRACTURES): Vit D, 25-Hydroxy: 83 ng/mL (ref 30–89)

## 2013-09-30 LAB — FERRITIN: Ferritin: 207 ng/mL (ref 10–291)

## 2013-10-05 ENCOUNTER — Encounter: Payer: Self-pay | Admitting: Physician Assistant

## 2013-10-05 MED ORDER — VENLAFAXINE HCL ER 75 MG PO CP24: 75.0000 mg | ORAL_CAPSULE | Freq: Every day | ORAL | Status: DC

## 2013-11-01 ENCOUNTER — Other Ambulatory Visit: Payer: Self-pay | Admitting: Physician Assistant

## 2013-11-01 MED ORDER — AMPHETAMINE-DEXTROAMPHETAMINE 30 MG PO TABS: 30.0000 mg | ORAL_TABLET | Freq: Two times a day (BID) | ORAL | Status: DC

## 2013-12-08 ENCOUNTER — Other Ambulatory Visit: Payer: Self-pay | Admitting: Physician Assistant

## 2013-12-08 ENCOUNTER — Encounter: Payer: Self-pay | Admitting: Physician Assistant

## 2013-12-08 MED ORDER — BUPROPION HCL ER (XL) 300 MG PO TB24: 300.0000 mg | ORAL_TABLET | Freq: Every day | ORAL | Status: DC

## 2014-01-02 ENCOUNTER — Other Ambulatory Visit: Payer: Self-pay | Admitting: Physician Assistant

## 2014-01-02 MED ORDER — AMPHETAMINE-DEXTROAMPHETAMINE 30 MG PO TABS: 30.0000 mg | ORAL_TABLET | Freq: Two times a day (BID) | ORAL | Status: DC

## 2014-01-03 MED ORDER — AMPHETAMINE-DEXTROAMPHETAMINE 30 MG PO TABS: 30.0000 mg | ORAL_TABLET | Freq: Two times a day (BID) | ORAL | Status: DC

## 2014-01-03 MED ORDER — ALPRAZOLAM 0.5 MG PO TABS: ORAL_TABLET | ORAL | Status: DC

## 2014-01-03 NOTE — Addendum Note (Signed)
Addended by: Vicie Mutters R on: 01/03/2014 01:31 PM   Modules accepted: Orders

## 2014-01-28 ENCOUNTER — Encounter: Payer: Self-pay | Admitting: *Deleted

## 2014-02-10 ENCOUNTER — Other Ambulatory Visit: Payer: Self-pay | Admitting: Physician Assistant

## 2014-02-10 MED ORDER — BUPROPION HCL ER (XL) 300 MG PO TB24: 300.0000 mg | ORAL_TABLET | Freq: Every day | ORAL | Status: DC

## 2014-02-11 ENCOUNTER — Encounter: Payer: Self-pay | Admitting: *Deleted

## 2014-03-07 ENCOUNTER — Other Ambulatory Visit: Payer: Self-pay | Admitting: Physician Assistant

## 2014-03-07 MED ORDER — AMPHETAMINE-DEXTROAMPHETAMINE 30 MG PO TABS: 30.0000 mg | ORAL_TABLET | Freq: Two times a day (BID) | ORAL | Status: DC

## 2014-05-08 ENCOUNTER — Encounter: Payer: Self-pay | Admitting: Physician Assistant

## 2014-05-09 MED ORDER — ADDERALL 30 MG PO TABS: 30.0000 mg | ORAL_TABLET | Freq: Two times a day (BID) | ORAL | Status: DC

## 2014-05-09 MED ORDER — ALPRAZOLAM 0.5 MG PO TABS: ORAL_TABLET | ORAL | Status: DC

## 2014-06-24 ENCOUNTER — Other Ambulatory Visit: Payer: Self-pay | Admitting: Physician Assistant

## 2014-07-12 ENCOUNTER — Encounter: Payer: Self-pay | Admitting: Physician Assistant

## 2014-07-13 MED ORDER — AMPHETAMINE-DEXTROAMPHET ER 30 MG PO CP24: 30.0000 mg | ORAL_CAPSULE | Freq: Every day | ORAL | Status: DC

## 2014-07-13 MED ORDER — ALPRAZOLAM 0.5 MG PO TABS: ORAL_TABLET | ORAL | Status: DC

## 2014-08-11 ENCOUNTER — Other Ambulatory Visit: Payer: Self-pay

## 2014-08-11 MED ORDER — BUPROPION HCL ER (XL) 300 MG PO TB24: 300.0000 mg | ORAL_TABLET | Freq: Every day | ORAL | Status: DC

## 2014-08-11 MED ORDER — AMPHETAMINE-DEXTROAMPHET ER 30 MG PO CP24: 30.0000 mg | ORAL_CAPSULE | Freq: Every day | ORAL | Status: DC

## 2014-10-02 ENCOUNTER — Other Ambulatory Visit: Payer: Self-pay | Admitting: Physician Assistant

## 2014-10-02 ENCOUNTER — Encounter: Payer: Self-pay | Admitting: Physician Assistant

## 2014-10-02 ENCOUNTER — Other Ambulatory Visit (HOSPITAL_COMMUNITY)
Admission: RE | Admit: 2014-10-02 | Discharge: 2014-10-02 | Disposition: A | Discharge status: Home / Self Care | Payer: PRIVATE HEALTH INSURANCE | Source: Ambulatory Visit | Attending: Physician Assistant | Admitting: Physician Assistant

## 2014-10-02 ENCOUNTER — Ambulatory Visit (INDEPENDENT_AMBULATORY_CARE_PROVIDER_SITE_OTHER): Payer: PRIVATE HEALTH INSURANCE | Admitting: Physician Assistant

## 2014-10-02 VITALS — BP 150/88 | HR 80 | Temp 97.9°F | Resp 16 | Ht 61.75 in | Wt 123.6 lb

## 2014-10-02 DIAGNOSIS — Z79899 Other long term (current) drug therapy: Secondary | ICD-10-CM

## 2014-10-02 DIAGNOSIS — E559 Vitamin D deficiency, unspecified: Secondary | ICD-10-CM

## 2014-10-02 DIAGNOSIS — B1920 Unspecified viral hepatitis C without hepatic coma: Secondary | ICD-10-CM

## 2014-10-02 DIAGNOSIS — Z0001 Encounter for general adult medical examination with abnormal findings: Secondary | ICD-10-CM

## 2014-10-02 DIAGNOSIS — M8580 Other specified disorders of bone density and structure, unspecified site: Secondary | ICD-10-CM

## 2014-10-02 DIAGNOSIS — E785 Hyperlipidemia, unspecified: Secondary | ICD-10-CM

## 2014-10-02 DIAGNOSIS — Z1151 Encounter for screening for human papillomavirus (HPV): Secondary | ICD-10-CM | POA: Diagnosis present

## 2014-10-02 DIAGNOSIS — F418 Other specified anxiety disorders: Secondary | ICD-10-CM | POA: Insufficient documentation

## 2014-10-02 DIAGNOSIS — F988 Other specified behavioral and emotional disorders with onset usually occurring in childhood and adolescence: Secondary | ICD-10-CM | POA: Insufficient documentation

## 2014-10-02 DIAGNOSIS — I10 Essential (primary) hypertension: Secondary | ICD-10-CM

## 2014-10-02 DIAGNOSIS — Z01419 Encounter for gynecological examination (general) (routine) without abnormal findings: Secondary | ICD-10-CM | POA: Insufficient documentation

## 2014-10-02 DIAGNOSIS — Z0000 Encounter for general adult medical examination without abnormal findings: Secondary | ICD-10-CM

## 2014-10-02 DIAGNOSIS — C801 Malignant (primary) neoplasm, unspecified: Secondary | ICD-10-CM

## 2014-10-02 DIAGNOSIS — Z124 Encounter for screening for malignant neoplasm of cervix: Secondary | ICD-10-CM | POA: Diagnosis not present

## 2014-10-02 DIAGNOSIS — K529 Noninfective gastroenteritis and colitis, unspecified: Secondary | ICD-10-CM

## 2014-10-02 DIAGNOSIS — K7581 Nonalcoholic steatohepatitis (NASH): Secondary | ICD-10-CM

## 2014-10-02 MED ORDER — AMPHETAMINE-DEXTROAMPHET ER 25 MG PO CP24: 3025.0000 mg | ORAL_CAPSULE | Freq: Two times a day (BID) | ORAL | Status: DC

## 2014-10-02 MED ORDER — OSPEMIFENE 60 MG PO TABS: ORAL_TABLET | ORAL | Status: DC

## 2014-10-02 MED ORDER — LISINOPRIL-HYDROCHLOROTHIAZIDE 20-25 MG PO TABS: 1.0000 | ORAL_TABLET | Freq: Every day | ORAL | Status: DC

## 2014-10-02 MED ORDER — AMPHETAMINE-DEXTROAMPHET ER 25 MG PO CP24: 25.0000 mg | ORAL_CAPSULE | Freq: Two times a day (BID) | ORAL | Status: DC

## 2014-10-02 MED ORDER — BUPROPION HCL ER (XL) 300 MG PO TB24: 300.0000 mg | ORAL_TABLET | Freq: Every day | ORAL | Status: DC

## 2014-10-02 MED ORDER — ALPRAZOLAM 0.5 MG PO TABS: ORAL_TABLET | ORAL | Status: DC

## 2014-10-02 NOTE — Progress Notes (Signed)
Complete Physical/ Wellness visit  Assessment and Plan: 1. Essential hypertension Get back on medication.  -  DASH diet, exercise and monitor at home. Call if greater than 130/80.  - EKG 12-Lead  2. NASH (nonalcoholic steatohepatitis) Check labs, avoid tylenol, alcohol, weight loss advised.   3. Hepatitis C virus infection without hepatic coma, unspecified chronicity Follow up [facility]  4. Hyperlipidemia -continue medications, check lipids, decrease fatty foods, increase activity.  5. Colitis No symptoms  6. Osteopenia Get DEXA next year  7. Cancer Get MGM Long discussion again about osphena use and history of breast cancer, patient understands risk but wishes to continue osphena use, only uses every other day  8. Depression with anxiety Continue wellbutrin  9. ADD (attention deficit disorder) Continue medication  10. Encounter for general adult medical examination with abnormal findings Gets labs at [facility] - EKG 12-Lead - Cytology - PAP  11. Screening for cervical cancer - Cytology - PAP  12. Medication management  13. Vitamin D deficiency Continue supplement   Discussed med's effects and SE's. Screening labs and tests as requested with regular follow-up as recommended.  WILL GET LABS AT [facility] DUE TO INSURANCE  HPI 59 y.o. female  presents for a complete physical.  Her blood pressure has not been controlled at home, she has been out of her BP meds for 1 month, today he BP is BP: (!) 176/102 mmHg She does workout, but due to the heat has not worked out as much. She denies chest pain, shortness of breath, dizziness.  She is not on cholesterol medication and denies myalgias. Her cholesterol is at goal. The cholesterol last visit was:   Lab Results  Component Value Date   CHOL 262* 09/29/2013   HDL 140 09/29/2013   LDLCALC 113* 09/29/2013   TRIG 47 09/29/2013   CHOLHDL 1.9 09/29/2013   Lab Results  Component Value Date   HGBA1C 5.3 09/29/2013    Patient is on Vitamin D supplement.   Lab Results  Component Value Date   VD25OH 4 09/29/2013     She has a history of hepatitis C, has not had treatment yet, trying to get approved for new medication with her insurance, she is switching to a different doctor at [facility]. And she has a history of NASH via CT AB in 2015.  Lab Results  Component Value Date   ALT 72* 09/29/2013   AST 62* 09/29/2013   ALKPHOS 133* 09/29/2013   BILITOT 0.9 09/29/2013   Patient is on an ADD medication, she states that the medication is helping and she denies any adverse reactions.  She has a history of breast cancer, she is due this year for [facility], suggest that she gets 3D MGM.  She has been on osphena for vaginal dryness and states that it has done well.  She is on wellbutrin and xanax for anxiety/depression.  Marries with 1 son, works at [employer].    Current Medications:  Current Outpatient Prescriptions on File Prior to Visit  Medication Sig Dispense Refill  . ALPRAZolam (XANAX) 0.5 MG tablet TAKE 1 TABLET TWICE A DAY 60 tablet 1  . amphetamine-dextroamphetamine (ADDERALL XR) 30 MG 24 hr capsule Take 1 capsule (30 mg total) by mouth daily. 30 capsule 0  . buPROPion (WELLBUTRIN XL) 300 MG 24 hr tablet Take 1 tablet (300 mg total) by mouth daily. 30 tablet 3  . lisinopril-hydrochlorothiazide (PRINZIDE,ZESTORETIC) 20-25 MG per tablet TAKE ONE TABLET BY MOUTH ONE TIME DAILY  90 tablet  1  . MILK THISTLE PO Take 1 tablet by mouth daily.    . Multiple Vitamin (MULTIVITAMIN WITH MINERALS) TABS tablet Take 1 tablet by mouth daily.    . Ospemifene (OSPHENA) 60 MG TABS 1 pill daily or every other day as directed 30 tablet 3  . Probiotic Product (PROBIOTIC DAILY PO) Take 1 tablet by mouth daily.     No current facility-administered medications on file prior to visit.   Health Maintenance:   Immunization History  Administered Date(s) Administered  . Tdap 09/19/2008   Tetanus: 2010 Pneumovax: Prenvar 13:   Flu vaccine: 2014 Zostavax: Pap: 2014 history of LEEP in 1994, ASCUS in 2010 sees Dr. Matthew Saras 3DMGM: 08/2013 with implants DEXA: 2012 Colonoscopy: 09/25/2013- with Dr. Alice Reichert  EGD: CTAB 2015: NASH and Colitis Echo 2011: normal EF 60-65%  Eye: Dr. Gershon Crane has appointment coming up Dentist: Dr. Benito Mccreedy  Allergies: No Known Allergies Medical History:  Past Medical History  Diagnosis Date  . Hyperlipidemia   . Hypertension   . ADD (attention deficit disorder)   . Cancer 2004    Breast   . 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 Leep  1994  . Tubal ligation  2004  . Ovarian cyst removal Left 2004   Family History:  Family History  Problem Relation Age of Onset  . Cancer Mother   . Stroke Father   . Hypertension Father   . Cancer Sister    Social History:  Social History  Substance Use Topics  . Smoking status: Former Smoker    Types: Cigarettes    Quit date: 02/04/1988  . Smokeless tobacco: Never Used  . Alcohol Use: Yes   Review of Systems  Constitutional: Negative.   HENT: Negative.   Eyes: Negative.   Respiratory: Negative.   Cardiovascular: Negative.   Gastrointestinal: Negative.   Genitourinary: Negative.   Musculoskeletal: Negative.   Skin: Negative.   Neurological: Negative.   Endo/Heme/Allergies: Negative.   Psychiatric/Behavioral: Negative.      Physical Exam: Estimated body mass index is 22.8 kg/(m^2) as calculated from the following:   Height as of this encounter: 5' 1.75" (1.568 m).   Weight as of this encounter: 123 lb 9.6 oz (56.065 kg). BP 176/102 mmHg  Pulse 80  Temp(Src) 97.9 F (36.6 C)  Resp 16  Ht 5' 1.75" (1.568 m)  Wt 123 lb 9.6 oz (56.065 kg)  BMI 22.80 kg/m2 General Appearance: Well nourished, in no apparent distress. Eyes: PERRLA, EOMs, conjunctiva no swelling or erythema, normal fundi and vessels. Sinuses: No Frontal/maxillary  tenderness ENT/Mouth: Ext aud canals clear, normal light reflex with TMs without erythema, bulging.  Good dentition. No erythema, swelling, or exudate on post pharynx. Tonsils not swollen or erythematous. Hearing normal.  Neck: Supple, thyroid normal. No bruits Respiratory: Respiratory effort normal, BS equal bilaterally without rales, rhonchi, wheezing or stridor. Cardio: RRR without murmurs, rubs or gallops. Brisk peripheral pulses without edema.  Chest: symmetric, with normal excursions and percussion. Breasts: patient with bilateral implants, has scar/indention right breast at 12 o clock unchanged, no nipple retraction, discharge, lumps or masses Abdomen: Soft, +BS. Non tender, no guarding, rebound, hernias, masses, or organomegaly.  Lymphatics: Non tender without lymphadenopathy.  Genitourinary: normal external genitalia, vulva, vagina, cervix, uterus and adnexa, PAP SENT. Musculoskeletal: Full ROM all peripheral extremities,5/5 strength, and normal gait. Skin: Warm, dry without rashes, lesions, ecchymosis.  Neuro: Cranial nerves intact, reflexes equal bilaterally. Normal muscle tone, no cerebellar symptoms. Sensation intact.  Psych: Awake and oriented X 3, normal affect, Insight and Judgment appropriate.    Vicie Mutters 9:22 AM

## 2014-10-02 NOTE — Patient Instructions (Signed)
Preventive Care for Adults  A healthy lifestyle and preventive care can promote health and wellness. Preventive health guidelines for women include the following key practices.  A routine yearly physical is a good way to check with your health care provider about your health and preventive screening. It is a chance to share any concerns and updates on your health and to receive a thorough exam.  Visit your dentist for a routine exam and preventive care every 6 months. Brush your teeth twice a day and floss once a day. Good oral hygiene prevents tooth decay and gum disease.  The frequency of eye exams is based on your age, health, family medical history, use of contact lenses, and other factors. Follow your health care provider's recommendations for frequency of eye exams.  Eat a healthy diet. Foods like vegetables, fruits, whole grains, low-fat dairy products, and lean protein foods contain the nutrients you need without too many calories. Decrease your intake of foods high in solid fats, added sugars, and salt. Eat the right amount of calories for you.Get information about a proper diet from your health care provider, if necessary.  Regular physical exercise is one of the most important things you can do for your health. Most adults should get at least 150 minutes of moderate-intensity exercise (any activity that increases your heart rate and causes you to sweat) each week. In addition, most adults need muscle-strengthening exercises on 2 or more days a week.  Maintain a healthy weight. The body mass index (BMI) is a screening tool to identify possible weight problems. It provides an estimate of body fat based on height and weight. Your health care provider can find your BMI and can help you achieve or maintain a healthy weight.For adults 20 years and older:  A BMI below 18.5 is considered underweight.  A BMI of 18.5 to 24.9 is normal.  A BMI of 25 to 29.9 is considered overweight.  A BMI of  30 and above is considered obese.  Maintain normal blood lipids and cholesterol levels by exercising and minimizing your intake of saturated fat. Eat a balanced diet with plenty of fruit and vegetables. Blood tests for lipids and cholesterol should begin at age 20 and be repeated every 5 years. If your lipid or cholesterol levels are high, you are over 50, or you are at high risk for heart disease, you may need your cholesterol levels checked more frequently.Ongoing high lipid and cholesterol levels should be treated with medicines if diet and exercise are not working.  If you smoke, find out from your health care provider how to quit. If you do not use tobacco, do not start.  Lung cancer screening is recommended for adults aged 55-80 years who are at high risk for developing lung cancer because of a history of smoking. A yearly low-dose CT scan of the lungs is recommended for people who have at least a 30-pack-year history of smoking and are a current smoker or have quit within the past 15 years. A pack year of smoking is smoking an average of 1 pack of cigarettes a day for 1 year (for example: 1 pack a day for 30 years or 2 packs a day for 15 years). Yearly screening should continue until the smoker has stopped smoking for at least 15 years. Yearly screening should be stopped for people who develop a health problem that would prevent them from having lung cancer treatment.  High blood pressure causes heart disease and increases the risk of   stroke. Your blood pressure should be checked at least every 1 to 2 years. Ongoing high blood pressure should be treated with medicines if weight loss and exercise do not work.  If you are 55-79 years old, ask your health care provider if you should take aspirin to prevent strokes.  Diabetes screening involves taking a blood sample to check your fasting blood sugar level. This should be done once every 3 years, after age 45, if you are within normal weight and  without risk factors for diabetes. Testing should be considered at a younger age or be carried out more frequently if you are overweight and have at least 1 risk factor for diabetes.  Breast cancer screening is essential preventive care for women. You should practice "breast self-awareness." This means understanding the normal appearance and feel of your breasts and may include breast self-examination. Any changes detected, no matter how small, should be reported to a health care provider. Women in their 20s and 30s should have a clinical breast exam (CBE) by a health care provider as part of a regular health exam every 1 to 3 years. After age 40, women should have a CBE every year. Starting at age 40, women should consider having a mammogram (breast X-ray test) every year. Women who have a family history of breast cancer should talk to their health care provider about genetic screening. Women at a high risk of breast cancer should talk to their health care providers about having an MRI and a mammogram every year.  Breast cancer gene (BRCA)-related cancer risk assessment is recommended for women who have family members with BRCA-related cancers. BRCA-related cancers include breast, ovarian, tubal, and peritoneal cancers. Having family members with these cancers may be associated with an increased risk for harmful changes (mutations) in the breast cancer genes BRCA1 and BRCA2. Results of the assessment will determine the need for genetic counseling and BRCA1 and BRCA2 testing.  Routine pelvic exams to screen for cancer are no longer recommended for nonpregnant women who are considered low risk for cancer of the pelvic organs (ovaries, uterus, and vagina) and who do not have symptoms. Ask your health care provider if a screening pelvic exam is right for you.  If you have had past treatment for cervical cancer or a condition that could lead to cancer, you need Pap tests and screening for cancer for at least 20  years after your treatment. If Pap tests have been discontinued, your risk factors (such as having a new sexual partner) need to be reassessed to determine if screening should be resumed. Some women have medical problems that increase the chance of getting cervical cancer. In these cases, your health care provider may recommend more frequent screening and Pap tests.  Colorectal cancer can be detected and often prevented. Most routine colorectal cancer screening begins at the age of 50 years and continues through age 75 years. However, your health care provider may recommend screening at an earlier age if you have risk factors for colon cancer. On a yearly basis, your health care provider may provide home test kits to check for hidden blood in the stool. Use of a small camera at the end of a tube, to directly examine the colon (sigmoidoscopy or colonoscopy), can detect the earliest forms of colorectal cancer. Talk to your health care provider about this at age 50, when routine screening begins. Direct exam of the colon should be repeated every 5-10 years through age 75 years, unless early forms of pre-cancerous   polyps or small growths are found.  Hepatitis C blood testing is recommended for all people born from [birth year] through 1965 and any individual with known risks for hepatitis C.  Pra  Osteoporosis is a disease in which the bones lose minerals and strength with aging. This can result in serious bone fractures or breaks. The risk of osteoporosis can be identified using a bone density scan. Women ages 63 years and over and women at risk for fractures or osteoporosis should discuss screening with their health care providers. Ask your health care provider whether you should take a calcium supplement or vitamin D to reduce the rate of osteoporosis.  Menopause can be associated with physical symptoms and risks. Hormone replacement therapy is available to decrease symptoms and risks. You should talk to your  health care provider about whether hormone replacement therapy is right for you.  Use sunscreen. Apply sunscreen liberally and repeatedly throughout the day. You should seek shade when your shadow is shorter than you. Protect yourself by wearing long sleeves, pants, a wide-brimmed hat, and sunglasses year round, whenever you are outdoors.  Once a month, do a whole body skin exam, using a mirror to look at the skin on your back. Tell your health care provider of new moles, moles that have irregular borders, moles that are larger than a pencil eraser, or moles that have changed in shape or color.  Stay current with required vaccines (immunizations).  Influenza vaccine. All adults should be immunized every year.  Tetanus, diphtheria, and acellular pertussis (Td, Tdap) vaccine. Pregnant women should receive 1 dose of Tdap vaccine during each pregnancy. The dose should be obtained regardless of the length of time since the last dose. Immunization is preferred during the 27th-36th week of gestation. An adult who has not previously received Tdap or who does not know her vaccine status should receive 1 dose of Tdap. This initial dose should be followed by tetanus and diphtheria toxoids (Td) booster doses every 10 years. Adults with an unknown or incomplete history of completing a 3-dose immunization series with Td-containing vaccines should begin or complete a primary immunization series including a Tdap dose. Adults should receive a Td booster every 10 years.  Varicella vaccine. An adult without evidence of immunity to varicella should receive 2 doses or a second dose if she has previously received 1 dose. Pregnant females who do not have evidence of immunity should receive the first dose after pregnancy. This first dose should be obtained before leaving the health care facility. The second dose should be obtained 4-8 weeks after the first dose.  Human papillomavirus (HPV) vaccine. Females aged 13-26 years  who have not received the vaccine previously should obtain the 3-dose series. The vaccine is not recommended for use in pregnant females. However, pregnancy testing is not needed before receiving a dose. If a female is found to be pregnant after receiving a dose, no treatment is needed. In that case, the remaining doses should be delayed until after the pregnancy. Immunization is recommended for any person with an immunocompromised condition through the age of 17 years if she did not get any or all doses earlier. During the 3-dose series, the second dose should be obtained 4-8 weeks after the first dose. The third dose should be obtained 24 weeks after the first dose and 16 weeks after the second dose.  Zoster vaccine. One dose is recommended for adults aged 39 years or older unless certain conditions are present.  Measles, mumps, and rubella (  MMR) vaccine. Adults born before [birth year] generally are considered immune to measles and mumps. Adults born in [birth year] or later should have 1 or more doses of MMR vaccine unless there is a contraindication to the vaccine or there is laboratory evidence of immunity to each of the three diseases. A routine second dose of MMR vaccine should be obtained at least 28 days after the first dose for students attending postsecondary schools, health care workers, or international travelers. People who received inactivated measles vaccine or an unknown type of measles vaccine during 1963-1967 should receive 2 doses of MMR vaccine. People who received inactivated mumps vaccine or an unknown type of mumps vaccine before 1979 and are at high risk for mumps infection should consider immunization with 2 doses of MMR vaccine. For females of childbearing age, rubella immunity should be determined. If there is no evidence of immunity, females who are not pregnant should be vaccinated. If there is no evidence of immunity, females who are pregnant should delay immunization until after pregnancy.  Unvaccinated health care workers born before [birth year] who lack laboratory evidence of measles, mumps, or rubella immunity or laboratory confirmation of disease should consider measles and mumps immunization with 2 doses of MMR vaccine or rubella immunization with 1 dose of MMR vaccine.  Pneumococcal 13-valent conjugate (PCV13) vaccine. When indicated, a person who is uncertain of her immunization history and has no record of immunization should receive the PCV13 vaccine. An adult aged 63 years or older who has certain medical conditions and has not been previously immunized should receive 1 dose of PCV13 vaccine. This PCV13 should be followed with a dose of pneumococcal polysaccharide (PPSV23) vaccine. The PPSV23 vaccine dose should be obtained at least 8 weeks after the dose of PCV13 vaccine. An adult aged 60 years or older who has certain medical conditions and previously received 1 or more doses of PPSV23 vaccine should receive 1 dose of PCV13. The PCV13 vaccine dose should be obtained 1 or more years after the last PPSV23 vaccine dose.    Pneumococcal polysaccharide (PPSV23) vaccine. When PCV13 is also indicated, PCV13 should be obtained first. All adults aged 16 years and older should be immunized. An adult younger than age 67 years who has certain medical conditions should be immunized. Any person who resides in a nursing home or long-term care facility should be immunized. An adult smoker should be immunized. People with an immunocompromised condition and certain other conditions should receive both PCV13 and PPSV23 vaccines. People with human immunodeficiency virus (HIV) infection should be immunized as soon as possible after diagnosis. Immunization during chemotherapy or radiation therapy should be avoided. Routine use of PPSV23 vaccine is not recommended for American Indians, [place] Natives, or people younger than 65 years unless there are medical conditions that require PPSV23 vaccine. When indicated,  people who have unknown immunization and have no record of immunization should receive PPSV23 vaccine. One-time revaccination 5 years after the first dose of PPSV23 is recommended for people aged 19-64 years who have chronic kidney failure, nephrotic syndrome, asplenia, or immunocompromised conditions. People who received 1-2 doses of PPSV23 before age 20 years should receive another dose of PPSV23 vaccine at age 43 years or later if at least 5 years have passed since the previous dose. Doses of PPSV23 are not needed for people immunized with PPSV23 at or after age 70 years.  Preventive Services / Frequency   Ages 64 to 60 years  Blood pressure check.  Lipid and cholesterol check.  Lung  cancer screening. / Every year if you are aged 74-80 years and have a 30-pack-year history of smoking and currently smoke or have quit within the past 15 years. Yearly screening is stopped once you have quit smoking for at least 15 years or develop a health problem that would prevent you from having lung cancer treatment.  Clinical breast exam.** / Every year after age 45 years.  BRCA-related cancer risk assessment.** / For women who have family members with a BRCA-related cancer (breast, ovarian, tubal, or peritoneal cancers).  Mammogram.** / Every year beginning at age 23 years and continuing for as long as you are in good health. Consult with your health care provider.  Pap test.** / Every 3 years starting at age 67 years through age 77 or 45 years with a history of 3 consecutive normal Pap tests.  HPV screening.** / Every 3 years from ages 71 years through ages 48 to 36 years with a history of 3 consecutive normal Pap tests.  Fecal occult blood test (FOBT) of stool. / Every year beginning at age 70 years and continuing until age 66 years. You may not need to do this test if you get a colonoscopy every 10 years.  Flexible sigmoidoscopy or colonoscopy.** / Every 5 years for a flexible sigmoidoscopy or  every 10 years for a colonoscopy beginning at age 63 years and continuing until age 21 years.  Hepatitis C blood test.** / For all people born from [birth year] through 1964 and any individual with known risks for hepatitis C.  Skin self-exam. / Monthly.  Influenza vaccine. / Every year.  Tetanus, diphtheria, and acellular pertussis (Tdap/Td) vaccine.** / Consult your health care provider. Pregnant women should receive 1 dose of Tdap vaccine during each pregnancy. 1 dose of Td every 10 years.  Varicella vaccine.** / Consult your health care provider. Pregnant females who do not have evidence of immunity should receive the first dose after pregnancy.  Zoster vaccine.** / 1 dose for adults aged 25 years or older.  Pneumococcal 13-valent conjugate (PCV13) vaccine.** / Consult your health care provider.  Pneumococcal polysaccharide (PPSV23) vaccine.** / 1 to 2 doses if you smoke cigarettes or if you have certain conditions.  Meningococcal vaccine.** / Consult your health care provider.  Hepatitis A vaccine.** / Consult your health care provider.  Hepatitis B vaccine.** / Consult your health care provider. Screening for abdominal aortic aneurysm (AAA)  by ultrasound is recommended for people over 50 who have history of high blood pressure or who are current or former smokers.

## 2014-10-03 LAB — CBC WITH DIFFERENTIAL/PLATELET
BASOS: 1 %
Basophils Absolute: 0 10*3/uL (ref 0.0–0.2)
EOS (ABSOLUTE): 0.1 10*3/uL (ref 0.0–0.4)
EOS: 2 %
HEMATOCRIT: 40.2 % (ref 34.0–46.6)
HEMOGLOBIN: 13.8 g/dL (ref 11.1–15.9)
IMMATURE GRANS (ABS): 0 10*3/uL (ref 0.0–0.1)
Immature Granulocytes: 0 %
LYMPHS: 35 %
Lymphocytes Absolute: 1.5 10*3/uL (ref 0.7–3.1)
MCH: 31.3 pg (ref 26.6–33.0)
MCHC: 34.3 g/dL (ref 31.5–35.7)
MCV: 91 fL (ref 79–97)
MONOCYTES: 9 %
Monocytes Absolute: 0.4 10*3/uL (ref 0.1–0.9)
NEUTROS ABS: 2.4 10*3/uL (ref 1.4–7.0)
Neutrophils: 53 %
Platelets: 300 10*3/uL (ref 150–379)
RBC: 4.41 x10E6/uL (ref 3.77–5.28)
RDW: 12.8 % (ref 12.3–15.4)
WBC: 4.4 10*3/uL (ref 3.4–10.8)

## 2014-10-03 LAB — URINALYSIS, ROUTINE W REFLEX MICROSCOPIC
Bilirubin, UA: NEGATIVE
Glucose, UA: NEGATIVE
KETONES UA: NEGATIVE
Leukocytes, UA: NEGATIVE
NITRITE UA: NEGATIVE
PH UA: 8 — AB (ref 5.0–7.5)
Protein, UA: NEGATIVE
RBC, UA: NEGATIVE
SPEC GRAV UA: 1.014 (ref 1.005–1.030)
Urobilinogen, Ur: 0.2 mg/dL (ref 0.2–1.0)

## 2014-10-03 LAB — MAGNESIUM: MAGNESIUM: 2 mg/dL (ref 1.6–2.3)

## 2014-10-03 LAB — HEPATIC FUNCTION PANEL
ALT: 66 IU/L — AB (ref 0–32)
AST: 47 IU/L — ABNORMAL HIGH (ref 0–40)
Albumin: 4.5 g/dL (ref 3.5–5.5)
Alkaline Phosphatase: 89 IU/L (ref 39–117)
BILIRUBIN, DIRECT: 0.17 mg/dL (ref 0.00–0.40)
Bilirubin Total: 0.4 mg/dL (ref 0.0–1.2)
TOTAL PROTEIN: 7 g/dL (ref 6.0–8.5)

## 2014-10-03 LAB — BASIC METABOLIC PANEL
BUN/Creatinine Ratio: 10 (ref 9–23)
BUN: 8 mg/dL (ref 6–24)
CALCIUM: 9.6 mg/dL (ref 8.7–10.2)
CHLORIDE: 101 mmol/L (ref 97–108)
CO2: 27 mmol/L (ref 18–29)
Creatinine, Ser: 0.82 mg/dL (ref 0.57–1.00)
GFR calc Af Amer: 91 mL/min/{1.73_m2} (ref >59)
GFR, EST NON AFRICAN AMERICAN: 79 mL/min/{1.73_m2} (ref >59)
Glucose: 93 mg/dL (ref 65–99)
POTASSIUM: 4 mmol/L (ref 3.5–5.2)
Sodium: 143 mmol/L (ref 134–144)

## 2014-10-03 LAB — LIPID PANEL W/O CHOL/HDL RATIO
CHOLESTEROL TOTAL: 205 mg/dL — AB (ref 100–199)
HDL: 110 mg/dL (ref >39)
LDL CALC: 81 mg/dL (ref 0–99)
TRIGLYCERIDES: 70 mg/dL (ref 0–149)
VLDL CHOLESTEROL CAL: 14 mg/dL (ref 5–40)

## 2014-10-03 LAB — MICROALBUMIN, URINE

## 2014-10-03 LAB — VITAMIN D 25 HYDROXY (VIT D DEFICIENCY, FRACTURES): VIT D 25 HYDROXY: 51.9 ng/mL (ref 30.0–100.0)

## 2014-10-04 LAB — CYTOLOGY - PAP

## 2014-10-26 ENCOUNTER — Encounter: Payer: Self-pay | Admitting: Physician Assistant

## 2014-11-15 ENCOUNTER — Other Ambulatory Visit: Payer: Self-pay | Admitting: Physician Assistant

## 2014-11-15 ENCOUNTER — Other Ambulatory Visit: Payer: Self-pay | Admitting: Internal Medicine

## 2014-11-15 DIAGNOSIS — F988 Other specified behavioral and emotional disorders with onset usually occurring in childhood and adolescence: Secondary | ICD-10-CM

## 2014-11-15 MED ORDER — AMPHETAMINE-DEXTROAMPHET ER 25 MG PO CP24: 25.0000 mg | ORAL_CAPSULE | Freq: Two times a day (BID) | ORAL | Status: DC

## 2014-11-24 ENCOUNTER — Other Ambulatory Visit: Payer: Self-pay

## 2014-11-24 DIAGNOSIS — Z1231 Encounter for screening mammogram for malignant neoplasm of breast: Secondary | ICD-10-CM

## 2014-12-06 ENCOUNTER — Other Ambulatory Visit: Payer: Self-pay | Admitting: Physician Assistant

## 2014-12-25 ENCOUNTER — Other Ambulatory Visit: Payer: Self-pay | Admitting: Internal Medicine

## 2014-12-25 DIAGNOSIS — F988 Other specified behavioral and emotional disorders with onset usually occurring in childhood and adolescence: Secondary | ICD-10-CM

## 2014-12-25 MED ORDER — AMPHETAMINE-DEXTROAMPHET ER 25 MG PO CP24: 25.0000 mg | ORAL_CAPSULE | Freq: Two times a day (BID) | ORAL | Status: DC

## 2015-01-02 ENCOUNTER — Ambulatory Visit
Admission: RE | Admit: 2015-01-02 | Discharge: 2015-01-02 | Disposition: A | Discharge status: Home / Self Care | Payer: PRIVATE HEALTH INSURANCE | Source: Ambulatory Visit

## 2015-01-02 DIAGNOSIS — Z1231 Encounter for screening mammogram for malignant neoplasm of breast: Secondary | ICD-10-CM

## 2015-01-24 ENCOUNTER — Other Ambulatory Visit: Payer: Self-pay | Admitting: Physician Assistant

## 2015-02-16 ENCOUNTER — Other Ambulatory Visit: Payer: Self-pay | Admitting: Internal Medicine

## 2015-02-16 DIAGNOSIS — F988 Other specified behavioral and emotional disorders with onset usually occurring in childhood and adolescence: Secondary | ICD-10-CM

## 2015-02-16 MED ORDER — AMPHETAMINE-DEXTROAMPHET ER 25 MG PO CP24: ORAL_CAPSULE | ORAL | Status: DC

## 2015-03-08 ENCOUNTER — Encounter: Payer: Self-pay | Admitting: Physician Assistant

## 2015-03-08 ENCOUNTER — Other Ambulatory Visit: Payer: Self-pay | Admitting: Physician Assistant

## 2015-03-08 DIAGNOSIS — F419 Anxiety disorder, unspecified: Secondary | ICD-10-CM

## 2015-04-23 ENCOUNTER — Other Ambulatory Visit: Payer: Self-pay | Admitting: Physician Assistant

## 2015-04-25 ENCOUNTER — Other Ambulatory Visit: Payer: Self-pay | Admitting: Internal Medicine

## 2015-04-25 ENCOUNTER — Telehealth: Payer: Self-pay | Admitting: Internal Medicine

## 2015-04-25 DIAGNOSIS — F988 Other specified behavioral and emotional disorders with onset usually occurring in childhood and adolescence: Secondary | ICD-10-CM

## 2015-04-25 DIAGNOSIS — F419 Anxiety disorder, unspecified: Secondary | ICD-10-CM

## 2015-04-25 MED ORDER — ALPRAZOLAM 0.5 MG PO TABS: 0.5000 mg | ORAL_TABLET | Freq: Two times a day (BID) | ORAL | Status: DC

## 2015-04-25 MED ORDER — AMPHETAMINE-DEXTROAMPHET ER 25 MG PO CP24: ORAL_CAPSULE | ORAL | Status: DC

## 2015-04-25 NOTE — Addendum Note (Signed)
Addended by: Vicie Mutters R on: 04/25/2015 05:40 PM   Modules accepted: Orders

## 2015-06-26 ENCOUNTER — Other Ambulatory Visit: Payer: Self-pay | Admitting: Internal Medicine

## 2015-06-26 ENCOUNTER — Other Ambulatory Visit: Payer: Self-pay | Admitting: Physician Assistant

## 2015-06-26 DIAGNOSIS — F988 Other specified behavioral and emotional disorders with onset usually occurring in childhood and adolescence: Secondary | ICD-10-CM

## 2015-06-26 DIAGNOSIS — F419 Anxiety disorder, unspecified: Secondary | ICD-10-CM

## 2015-06-27 MED ORDER — ALPRAZOLAM 0.5 MG PO TABS: 0.5000 mg | ORAL_TABLET | Freq: Two times a day (BID) | ORAL | Status: DC

## 2015-06-27 MED ORDER — AMPHETAMINE-DEXTROAMPHET ER 25 MG PO CP24: ORAL_CAPSULE | ORAL | Status: DC

## 2015-06-27 NOTE — Addendum Note (Signed)
Addended by: Vicie Mutters R on: 06/27/2015 12:20 PM   Modules accepted: Orders

## 2015-07-30 ENCOUNTER — Other Ambulatory Visit: Payer: Self-pay | Admitting: Physician Assistant

## 2015-07-30 DIAGNOSIS — F988 Other specified behavioral and emotional disorders with onset usually occurring in childhood and adolescence: Secondary | ICD-10-CM

## 2015-07-30 MED ORDER — AMPHETAMINE-DEXTROAMPHET ER 25 MG PO CP24: ORAL_CAPSULE | ORAL | Status: DC

## 2015-07-31 ENCOUNTER — Other Ambulatory Visit: Payer: Self-pay | Admitting: Physician Assistant

## 2015-07-31 DIAGNOSIS — F419 Anxiety disorder, unspecified: Secondary | ICD-10-CM

## 2015-07-31 MED ORDER — ALPRAZOLAM 0.5 MG PO TABS: 0.5000 mg | ORAL_TABLET | Freq: Two times a day (BID) | ORAL | Status: DC

## 2015-08-01 ENCOUNTER — Other Ambulatory Visit: Payer: Self-pay | Admitting: Internal Medicine

## 2015-08-01 NOTE — Progress Notes (Signed)
Rx called into [facility] pharmacy.

## 2015-08-19 IMAGING — CT CT ABD-PELV W/ CM
1 of 3 series · 14 of 32 positions shown, 19 images · IV contrast (OMNIPAQUE 300)
Comparison: Abdominal CT scan October 25, 2010

CLINICAL DATA: Bloody stools and abdominal pain with new diagnosis
of hepatitis-C.

EXAM:
CT ABDOMEN AND PELVIS WITH CONTRAST
TECHNIQUE: Multidetector CT imaging of the abdomen and pelvis was performed
using the standard protocol following bolus administration of
intravenous contrast.
CONTRAST:  100mL OMNIPAQUE IOHEXOL 300 MG/ML SOLN intravenously
semi: The patient also received oral contrast material.

[Series 2: abd/pel with · axial · 0.77mm/px · z∈[+998,+1348]mm · 14 of 80 slices shown, 19 images]
[im 5/80  soft-tissue]
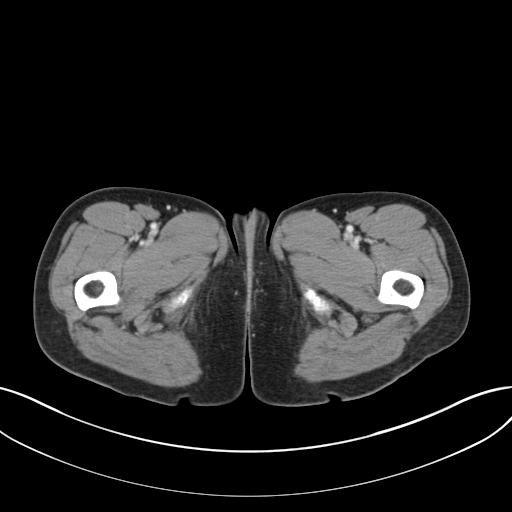
[im 5/80  bone]
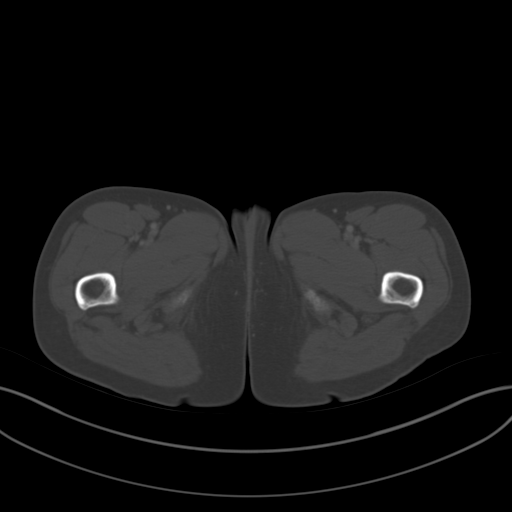
[im 9/80  soft-tissue]
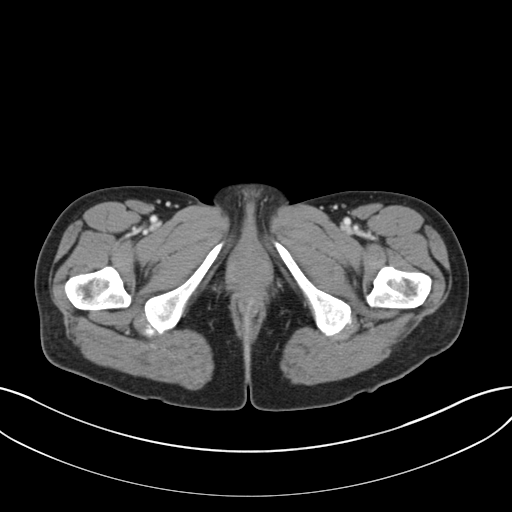
[im 18/80  soft-tissue]
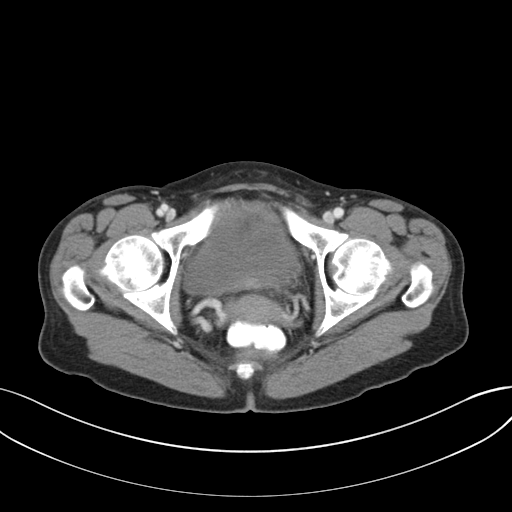
[im 22/80  soft-tissue]
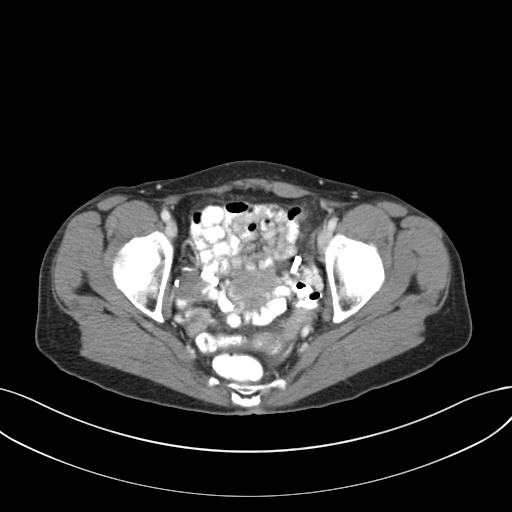
[im 27/80  soft-tissue]
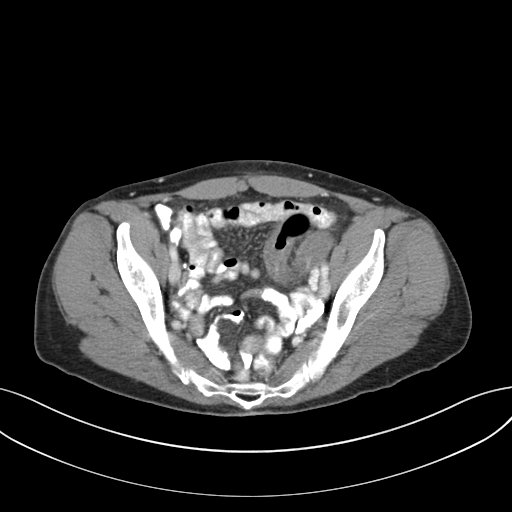
[im 36/80  soft-tissue]
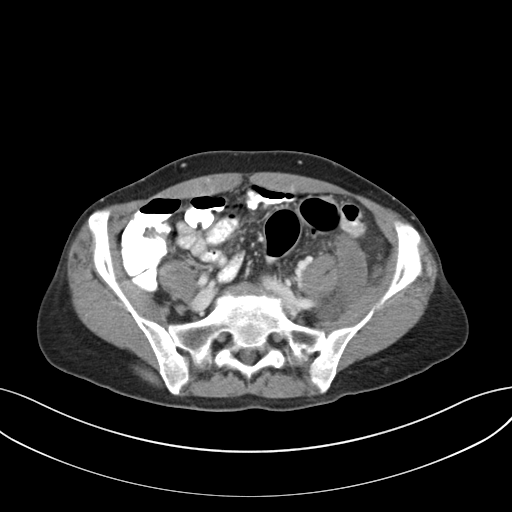
[im 40/80  soft-tissue]
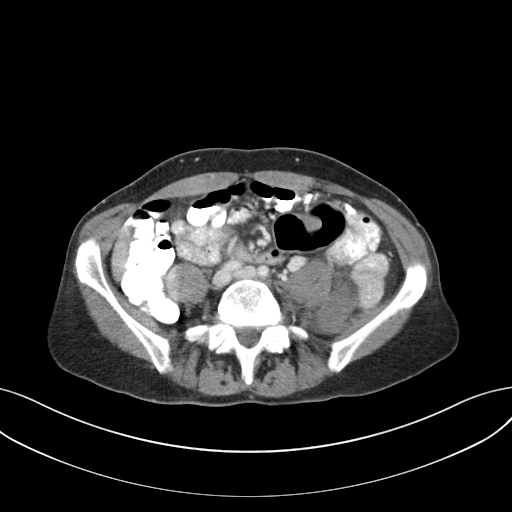
[im 44/80  soft-tissue]
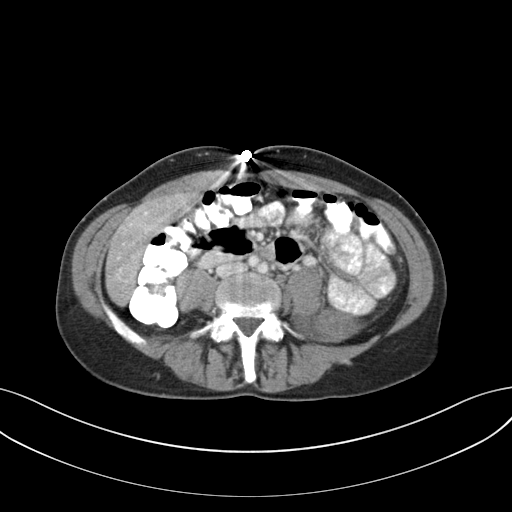
[im 53/80  soft-tissue]
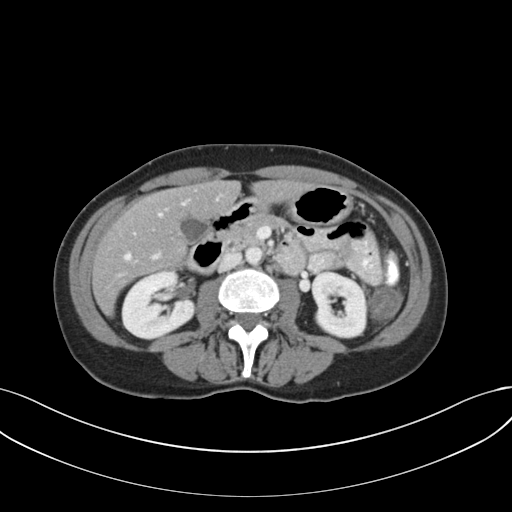
[im 53/80  bone]
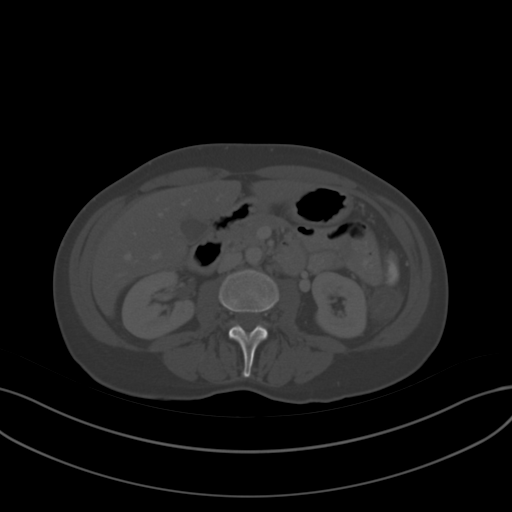
[im 58/80  soft-tissue]
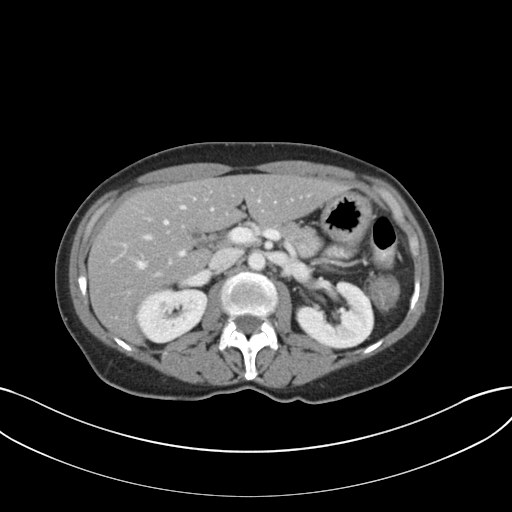
[im 62/80  soft-tissue]
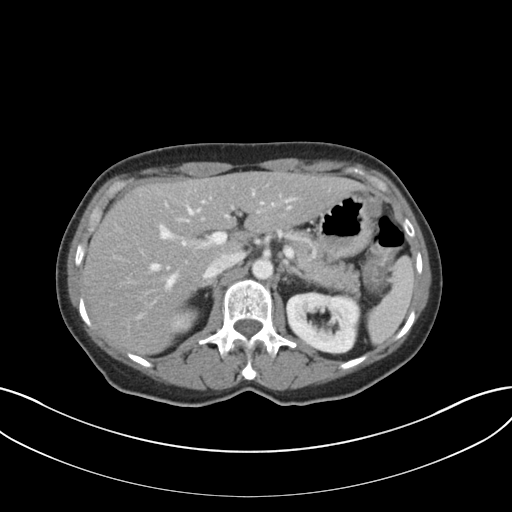
[im 62/80  lung]
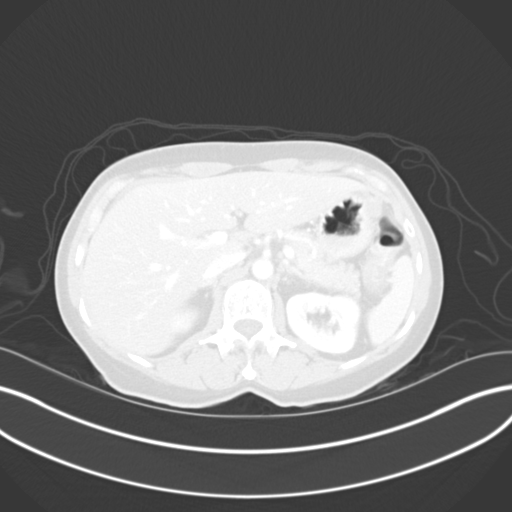
[im 66/80  lung]
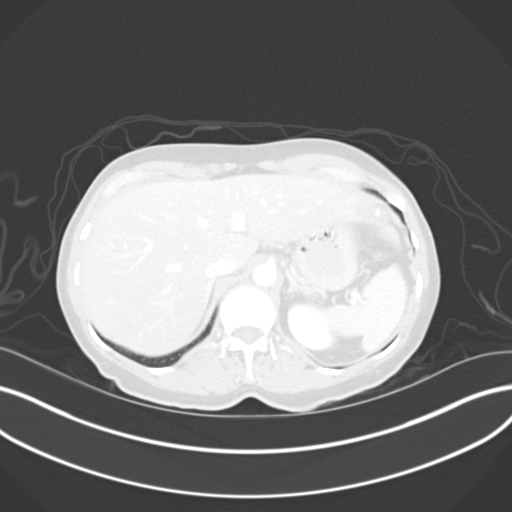
[im 71/80  soft-tissue]
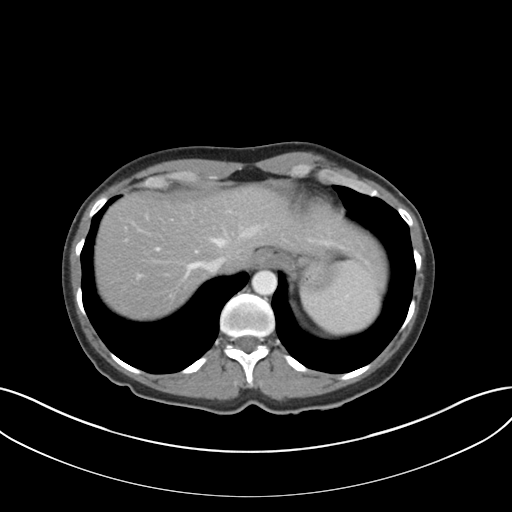
[im 71/80  lung]
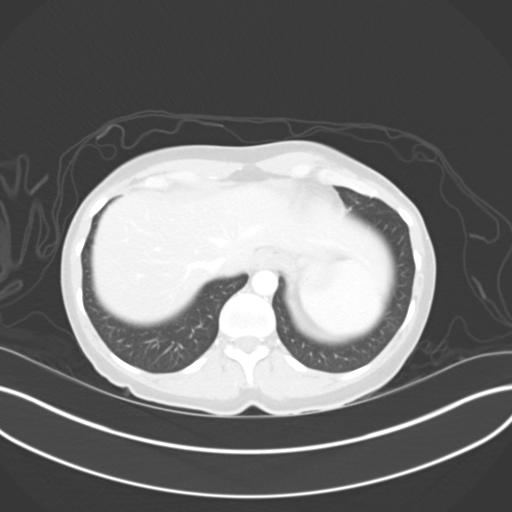
[im 75/80  soft-tissue]
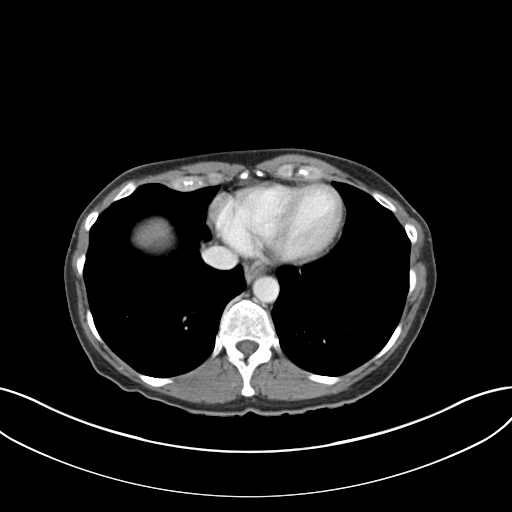
[im 75/80  lung]
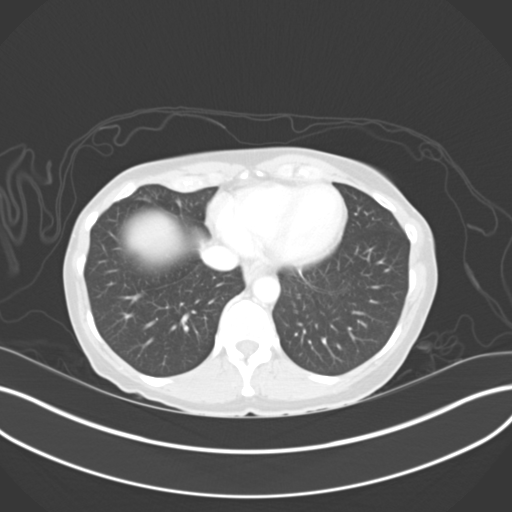

[14 of 32 positions shown; findings below may reference images not displayed]

FINDINGS: There is long segment thickening of the wall of the descending colon
and proximal rectosigmoid. A few diverticula are visible. There is
no obstruction to passage of the oral contrast which has reached the
rectum. More proximally the colon and small bowel are normal. There
is a normal appendix and terminal ileum.

The liver exhibits mildly decreased density consistent with fatty
infiltration. There is no focal mass or ductal dilation. The
gallbladder, pancreas, spleen, adrenal glands, and kidneys are
normal. The abdominal aorta is normal in caliber. The uterus and
adnexal structures are normal for age. Tubal ligation clips are
present bilaterally. The urinary bladder is normal.

There is disc space narrowing at L5-S1. The bony pelvis is
unremarkable. The lung bases are clear.
IMPRESSION: 1. There is diffuse mural thickening of the descending colon and
proximal rectosigmoid consistent with colitis. There is no evidence
of obstruction, perforation, or abscess formation.
2. There is no acute abnormality elsewhere within the abdomen or
pelvis.

## 2015-09-11 ENCOUNTER — Other Ambulatory Visit: Payer: Self-pay | Admitting: Physician Assistant

## 2015-09-11 ENCOUNTER — Other Ambulatory Visit: Payer: Self-pay | Admitting: Internal Medicine

## 2015-09-11 DIAGNOSIS — F419 Anxiety disorder, unspecified: Secondary | ICD-10-CM

## 2015-09-11 MED ORDER — ALPRAZOLAM 0.5 MG PO TABS: 0.5000 mg | ORAL_TABLET | Freq: Two times a day (BID) | ORAL | 0 refills | Status: DC

## 2015-10-02 ENCOUNTER — Ambulatory Visit (INDEPENDENT_AMBULATORY_CARE_PROVIDER_SITE_OTHER): Payer: Managed Care, Other (non HMO) | Admitting: Physician Assistant

## 2015-10-02 ENCOUNTER — Encounter: Payer: Self-pay | Admitting: Physician Assistant

## 2015-10-02 VITALS — BP 130/90 | HR 93 | Temp 97.3°F | Resp 16 | Ht 62.5 in | Wt 118.0 lb

## 2015-10-02 DIAGNOSIS — D649 Anemia, unspecified: Secondary | ICD-10-CM

## 2015-10-02 DIAGNOSIS — I10 Essential (primary) hypertension: Secondary | ICD-10-CM

## 2015-10-02 DIAGNOSIS — E785 Hyperlipidemia, unspecified: Secondary | ICD-10-CM

## 2015-10-02 DIAGNOSIS — E559 Vitamin D deficiency, unspecified: Secondary | ICD-10-CM

## 2015-10-02 DIAGNOSIS — Z0000 Encounter for general adult medical examination without abnormal findings: Secondary | ICD-10-CM | POA: Diagnosis not present

## 2015-10-02 DIAGNOSIS — Z136 Encounter for screening for cardiovascular disorders: Secondary | ICD-10-CM

## 2015-10-02 DIAGNOSIS — Z79899 Other long term (current) drug therapy: Secondary | ICD-10-CM | POA: Diagnosis not present

## 2015-10-02 DIAGNOSIS — K7581 Nonalcoholic steatohepatitis (NASH): Secondary | ICD-10-CM

## 2015-10-02 DIAGNOSIS — M8580 Other specified disorders of bone density and structure, unspecified site: Secondary | ICD-10-CM

## 2015-10-02 DIAGNOSIS — B1920 Unspecified viral hepatitis C without hepatic coma: Secondary | ICD-10-CM

## 2015-10-02 DIAGNOSIS — F988 Other specified behavioral and emotional disorders with onset usually occurring in childhood and adolescence: Secondary | ICD-10-CM

## 2015-10-02 DIAGNOSIS — K529 Noninfective gastroenteritis and colitis, unspecified: Secondary | ICD-10-CM

## 2015-10-02 DIAGNOSIS — Z0001 Encounter for general adult medical examination with abnormal findings: Secondary | ICD-10-CM

## 2015-10-02 DIAGNOSIS — F418 Other specified anxiety disorders: Secondary | ICD-10-CM

## 2015-10-02 DIAGNOSIS — C801 Malignant (primary) neoplasm, unspecified: Secondary | ICD-10-CM

## 2015-10-02 LAB — HEPATIC FUNCTION PANEL
ALT: 66 U/L — AB (ref 6–29)
AST: 64 U/L — AB (ref 10–35)
Albumin: 4.9 g/dL (ref 3.6–5.1)
Alkaline Phosphatase: 116 U/L (ref 33–130)
BILIRUBIN DIRECT: 0.2 mg/dL (ref <=0.2)
Indirect Bilirubin: 0.4 mg/dL (ref 0.2–1.2)
TOTAL PROTEIN: 8.1 g/dL (ref 6.1–8.1)
Total Bilirubin: 0.6 mg/dL (ref 0.2–1.2)

## 2015-10-02 LAB — IRON AND TIBC
%SAT: 44 % (ref 11–50)
IRON: 178 ug/dL — AB (ref 45–160)
TIBC: 406 ug/dL (ref 250–450)
UIBC: 228 ug/dL (ref 125–400)

## 2015-10-02 LAB — LIPID PANEL
CHOL/HDL RATIO: 1.7 ratio (ref <=5.0)
Cholesterol: 267 mg/dL — ABNORMAL HIGH (ref 125–200)
HDL: 160 mg/dL (ref >=46)
LDL Cholesterol: 94 mg/dL (ref <130)
Triglycerides: 63 mg/dL (ref <150)
VLDL: 13 mg/dL (ref <30)

## 2015-10-02 LAB — CBC WITH DIFFERENTIAL/PLATELET
BASOS ABS: 0 {cells}/uL (ref 0–200)
Basophils Relative: 0 %
EOS ABS: 118 {cells}/uL (ref 15–500)
Eosinophils Relative: 2 %
HEMATOCRIT: 45.7 % — AB (ref 35.0–45.0)
HEMOGLOBIN: 16 g/dL — AB (ref 11.7–15.5)
LYMPHS ABS: 1711 {cells}/uL (ref 850–3900)
Lymphocytes Relative: 29 %
MCH: 32.1 pg (ref 27.0–33.0)
MCHC: 35 g/dL (ref 32.0–36.0)
MCV: 91.8 fL (ref 80.0–100.0)
MONO ABS: 531 {cells}/uL (ref 200–950)
MPV: 10.4 fL (ref 7.5–12.5)
Monocytes Relative: 9 %
NEUTROS ABS: 3540 {cells}/uL (ref 1500–7800)
NEUTROS PCT: 60 %
Platelets: 343 10*3/uL (ref 140–400)
RBC: 4.98 MIL/uL (ref 3.80–5.10)
RDW: 12.2 % (ref 11.0–15.0)
WBC: 5.9 10*3/uL (ref 3.8–10.8)

## 2015-10-02 LAB — BASIC METABOLIC PANEL WITH GFR
BUN: 16 mg/dL (ref 7–25)
CHLORIDE: 95 mmol/L — AB (ref 98–110)
CO2: 31 mmol/L (ref 20–31)
Calcium: 10.5 mg/dL — ABNORMAL HIGH (ref 8.6–10.4)
Creat: 0.87 mg/dL (ref 0.50–1.05)
GFR, Est African American: 84 mL/min (ref >=60)
GFR, Est Non African American: 73 mL/min (ref >=60)
GLUCOSE: 111 mg/dL — AB (ref 65–99)
Potassium: 4.5 mmol/L (ref 3.5–5.3)
SODIUM: 137 mmol/L (ref 135–146)

## 2015-10-02 LAB — MAGNESIUM: MAGNESIUM: 2.1 mg/dL (ref 1.5–2.5)

## 2015-10-02 LAB — TSH: TSH: 1.81 m[IU]/L

## 2015-10-02 MED ORDER — HEPATITIS A-HEP B RECOMB VAC 720-20 ELU-MCG/ML IM SUSP: 1.0000 mL | Freq: Once | INTRAMUSCULAR | 2 refills | Status: AC

## 2015-10-02 MED ORDER — BUPROPION HCL ER (SR) 150 MG PO TB12: 150.0000 mg | ORAL_TABLET | Freq: Two times a day (BID) | ORAL | 2 refills | Status: DC

## 2015-10-02 MED ORDER — AMPHETAMINE-DEXTROAMPHETAMINE 20 MG PO TABS: 20.0000 mg | ORAL_TABLET | Freq: Two times a day (BID) | ORAL | 0 refills | Status: DC

## 2015-10-02 NOTE — Progress Notes (Signed)
Complete Physical/ Wellness visit  Assessment and Plan: Essential hypertension Get back on medication.  -  DASH diet, exercise and monitor at home. Call if greater than 130/80.  - EKG 12-Lead  NASH (nonalcoholic steatohepatitis) Check labs, avoid tylenol, alcohol, weight loss advised.   Hepatitis C virus infection without hepatic coma, unspecified chronicity Wants appointment in Jan Will do vaccine for hep A/B   Hyperlipidemia -continue medications, check lipids, decrease fatty foods, increase activity.  Colitis No symptoms  Osteopenia Get DEXA   Breast Cancer history Get MGM Long discussion again about osphena use and history of breast cancer, patient understands risk but wishes to continue osphena use, only uses every other day  Depression with anxiety Continue wellbutrin   ADD (attention deficit disorder) Continue medication  Encounter for general adult medical examination with abnormal findings Gets labs at [facility] - EKG 12-Lead - Cytology - PAP  Screening for cervical cancer q 3 years  Medication management  Vitamin D deficiency Continue supplement   Discussed med's effects and SE's. Screening labs and tests as requested with regular follow-up as recommended.  HPI 60 y.o. female  presents for a complete physical.  Her blood pressure has not been controlled at home, she has been out of her BP meds for 1 month, today he BP is   She does workout, but due to the heat has not worked out as much. She denies chest pain, shortness of breath, dizziness.  She is not on cholesterol medication and denies myalgias. Her cholesterol is at goal. The cholesterol last visit was:   Lab Results  Component Value Date   CHOL 205 (H) 10/02/2014   HDL 110 10/02/2014   LDLCALC 81 10/02/2014   TRIG 70 10/02/2014   CHOLHDL 1.9 09/29/2013   Lab Results  Component Value Date   HGBA1C 5.3 09/29/2013   Patient is on Vitamin D supplement.   Lab Results  Component Value Date    VD25OH 51.9 10/02/2014     She has a history of hepatitis C, has not had treatment yet, needs new referral. And she has a history of NASH via CT AB in 2015.  Lab Results  Component Value Date   ALT 66 (H) 10/02/2014   AST 47 (H) 10/02/2014   ALKPHOS 89 10/02/2014   BILITOT 0.4 10/02/2014   Patient is on an ADD medication, she states that the medication is helping and she denies any adverse reactions.  She has a history of breast cancer, has implants, had normal MGM in July 3D. She has been on osphena for vaginal dryness and states that it has done well.  She is on wellbutrin and xanax for anxiety/depression.  Married with 1 son, works at [employer] but company got bought out and now does not have copays for prescriptions and needs generic of them.    Current Medications:  Current Outpatient Prescriptions on File Prior to Visit  Medication Sig Dispense Refill  . ALPRAZolam (XANAX) 0.5 MG tablet Take 1 tablet (0.5 mg total) by mouth 2 (two) times daily. 60 tablet 0  . amphetamine-dextroamphetamine (ADDERALL XR) 25 MG 24 hr capsule Take 1 capsule 1 to 2 x / day for ADD 60 capsule 0  . buPROPion (WELLBUTRIN XL) 300 MG 24 hr tablet TAKE 1 TABLET BY MOUTH EVERY DAY 30 tablet 3  . lisinopril-hydrochlorothiazide (PRINZIDE,ZESTORETIC) 20-25 MG tablet TAKE 1 TABLET BY MOUTH DAILY. 90 tablet 1  . MILK THISTLE PO Take 1 tablet by mouth daily.    Marland Kitchen  Multiple Vitamin (MULTIVITAMIN WITH MINERALS) TABS tablet Take 1 tablet by mouth daily.    . Ospemifene (OSPHENA) 60 MG TABS 1 pill daily or every other day as directed 90 tablet 3  . Probiotic Product (PROBIOTIC DAILY PO) Take 1 tablet by mouth daily.     No current facility-administered medications on file prior to visit.    Health Maintenance:   Immunization History  Administered Date(s) Administered  . Tdap 09/19/2008   Tetanus: 2010 Pneumovax: Prenvar 13:  Flu vaccine: 2016 at work Zostavax: Pap: 2016 history of LEEP in 1994, ASCUS in 2010  sees Dr. Matthew Saras 3DMGM: 12/2014 with implants DEXA: 2012 DUE Colonoscopy: 09/25/2013- with Dr. Alice Reichert  EGD: CTAB 2015: NASH and Colitis Echo 2011: normal EF 60-65%  Eye: Dr. Gershon Crane has appointment coming up Dentist: Dr. Benito Mccreedy  Allergies: No Known Allergies Medical History:  Past Medical History:  Diagnosis Date  . ADD (attention deficit disorder)   . Cancer [facility]) 2004   Breast   . Hepatitis C 09/2012   Genotype 1a, sent to [facility]  . Hyperlipidemia   . Hypertension    Surgical History:  Past Surgical History:  Procedure Laterality Date  . LEEP  1994  . OVARIAN CYST REMOVAL Left 2004  . TUBAL LIGATION  2004   Family History:  Family History  Problem Relation Age of Onset  . Cancer Mother   . Stroke Father   . Hypertension Father   . Cancer Sister    Social History:  Social History  Substance Use Topics  . Smoking status: Former Smoker    Types: Cigarettes    Quit date: 02/04/1988  . Smokeless tobacco: Never Used  . Alcohol use Yes   Review of Systems  Constitutional: Negative.   HENT: Negative.   Eyes: Negative.   Respiratory: Negative.   Cardiovascular: Negative.   Gastrointestinal: Negative.   Genitourinary: Negative.   Musculoskeletal: Negative.   Skin: Negative.   Neurological: Negative.   Endo/Heme/Allergies: Negative.   Psychiatric/Behavioral: Negative.    Physical Exam: Estimated body mass index is 22.79 kg/m as calculated from the following:   Height as of 10/02/14: 5' 1.75" (1.568 m).   Weight as of 10/02/14: 123 lb 9.6 oz (56.1 kg). There were no vitals taken for this visit. General Appearance: Well nourished, in no apparent distress. Eyes: PERRLA, EOMs, conjunctiva no swelling or erythema, normal fundi and vessels. Sinuses: No Frontal/maxillary tenderness ENT/Mouth: Ext aud canals clear, normal light reflex with TMs without erythema, bulging.  Good dentition. No erythema, swelling, or exudate on post pharynx. Tonsils not swollen or  erythematous. Hearing normal.  Neck: Supple, thyroid normal. No bruits Respiratory: Respiratory effort normal, BS equal bilaterally without rales, rhonchi, wheezing or stridor. Cardio: RRR without murmurs, rubs or gallops. Brisk peripheral pulses without edema.  Chest: symmetric, with normal excursions and percussion. Breasts: patient with bilateral implants, has scar/indention right breast at 12 o clock unchanged, no nipple retraction, discharge, lumps or masses Abdomen: Soft, +BS. Non tender, no guarding, rebound, hernias, masses, or organomegaly.  Lymphatics: Non tender without lymphadenopathy.  Genitourinary: normal external genitalia, vulva, vagina, cervix, uterus and adnexa, PAP SENT. Musculoskeletal: Full ROM all peripheral extremities,5/5 strength, and normal gait. Skin: Warm, dry without rashes, lesions, ecchymosis.  Neuro: Cranial nerves intact, reflexes equal bilaterally. Normal muscle tone, no cerebellar symptoms. Sensation intact.  Psych: Awake and oriented X 3, normal affect, Insight and Judgment appropriate.    Vicie Mutters 8:41 AM

## 2015-10-02 NOTE — Patient Instructions (Signed)
Preventive Care for Adults  A healthy lifestyle and preventive care can promote health and wellness. Preventive health guidelines for women include the following key practices.  A routine yearly physical is a good way to check with your health care provider about your health and preventive screening. It is a chance to share any concerns and updates on your health and to receive a thorough exam.  Visit your dentist for a routine exam and preventive care every 6 months. Brush your teeth twice a day and floss once a day. Good oral hygiene prevents tooth decay and gum disease.  The frequency of eye exams is based on your age, health, family medical history, use of contact lenses, and other factors. Follow your health care provider's recommendations for frequency of eye exams.  Eat a healthy diet. Foods like vegetables, fruits, whole grains, low-fat dairy products, and lean protein foods contain the nutrients you need without too many calories. Decrease your intake of foods high in solid fats, added sugars, and salt. Eat the right amount of calories for you.Get information about a proper diet from your health care provider, if necessary.  Regular physical exercise is one of the most important things you can do for your health. Most adults should get at least 150 minutes of moderate-intensity exercise (any activity that increases your heart rate and causes you to sweat) each week. In addition, most adults need muscle-strengthening exercises on 2 or more days a week.  Maintain a healthy weight. The body mass index (BMI) is a screening tool to identify possible weight problems. It provides an estimate of body fat based on height and weight. Your health care provider can find your BMI and can help you achieve or maintain a healthy weight.For adults 20 years and older:  A BMI below 18.5 is considered underweight.  A BMI of 18.5 to 24.9 is normal.  A BMI of 25 to 29.9 is considered overweight.  A BMI of  30 and above is considered obese.  Maintain normal blood lipids and cholesterol levels by exercising and minimizing your intake of saturated fat. Eat a balanced diet with plenty of fruit and vegetables. Blood tests for lipids and cholesterol should begin at age 20 and be repeated every 5 years. If your lipid or cholesterol levels are high, you are over 50, or you are at high risk for heart disease, you may need your cholesterol levels checked more frequently.Ongoing high lipid and cholesterol levels should be treated with medicines if diet and exercise are not working.  If you smoke, find out from your health care provider how to quit. If you do not use tobacco, do not start.  Lung cancer screening is recommended for adults aged 55-80 years who are at high risk for developing lung cancer because of a history of smoking. A yearly low-dose CT scan of the lungs is recommended for people who have at least a 30-pack-year history of smoking and are a current smoker or have quit within the past 15 years. A pack year of smoking is smoking an average of 1 pack of cigarettes a day for 1 year (for example: 1 pack a day for 30 years or 2 packs a day for 15 years). Yearly screening should continue until the smoker has stopped smoking for at least 15 years. Yearly screening should be stopped for people who develop a health problem that would prevent them from having lung cancer treatment.  High blood pressure causes heart disease and increases the risk of   stroke. Your blood pressure should be checked at least every 1 to 2 years. Ongoing high blood pressure should be treated with medicines if weight loss and exercise do not work.  If you are 55-79 years old, ask your health care provider if you should take aspirin to prevent strokes.  Diabetes screening involves taking a blood sample to check your fasting blood sugar level. This should be done once every 3 years, after age 45, if you are within normal weight and  without risk factors for diabetes. Testing should be considered at a younger age or be carried out more frequently if you are overweight and have at least 1 risk factor for diabetes.  Breast cancer screening is essential preventive care for women. You should practice "breast self-awareness." This means understanding the normal appearance and feel of your breasts and may include breast self-examination. Any changes detected, no matter how small, should be reported to a health care provider. Women in their 20s and 30s should have a clinical breast exam (CBE) by a health care provider as part of a regular health exam every 1 to 3 years. After age 40, women should have a CBE every year. Starting at age 40, women should consider having a mammogram (breast X-ray test) every year. Women who have a family history of breast cancer should talk to their health care provider about genetic screening. Women at a high risk of breast cancer should talk to their health care providers about having an MRI and a mammogram every year.  Breast cancer gene (BRCA)-related cancer risk assessment is recommended for women who have family members with BRCA-related cancers. BRCA-related cancers include breast, ovarian, tubal, and peritoneal cancers. Having family members with these cancers may be associated with an increased risk for harmful changes (mutations) in the breast cancer genes BRCA1 and BRCA2. Results of the assessment will determine the need for genetic counseling and BRCA1 and BRCA2 testing.  Routine pelvic exams to screen for cancer are no longer recommended for nonpregnant women who are considered low risk for cancer of the pelvic organs (ovaries, uterus, and vagina) and who do not have symptoms. Ask your health care provider if a screening pelvic exam is right for you.  If you have had past treatment for cervical cancer or a condition that could lead to cancer, you need Pap tests and screening for cancer for at least 20  years after your treatment. If Pap tests have been discontinued, your risk factors (such as having a new sexual partner) need to be reassessed to determine if screening should be resumed. Some women have medical problems that increase the chance of getting cervical cancer. In these cases, your health care provider may recommend more frequent screening and Pap tests.  Colorectal cancer can be detected and often prevented. Most routine colorectal cancer screening begins at the age of 50 years and continues through age 75 years. However, your health care provider may recommend screening at an earlier age if you have risk factors for colon cancer. On a yearly basis, your health care provider may provide home test kits to check for hidden blood in the stool. Use of a small camera at the end of a tube, to directly examine the colon (sigmoidoscopy or colonoscopy), can detect the earliest forms of colorectal cancer. Talk to your health care provider about this at age 50, when routine screening begins. Direct exam of the colon should be repeated every 5-10 years through age 75 years, unless early forms of pre-cancerous   polyps or small growths are found.  Hepatitis C blood testing is recommended for all people born from 1945 through 1965 and any individual with known risks for hepatitis C.  Pra  Osteoporosis is a disease in which the bones lose minerals and strength with aging. This can result in serious bone fractures or breaks. The risk of osteoporosis can be identified using a bone density scan. Women ages 65 years and over and women at risk for fractures or osteoporosis should discuss screening with their health care providers. Ask your health care provider whether you should take a calcium supplement or vitamin D to reduce the rate of osteoporosis.  Menopause can be associated with physical symptoms and risks. Hormone replacement therapy is available to decrease symptoms and risks. You should talk to your  health care provider about whether hormone replacement therapy is right for you.  Use sunscreen. Apply sunscreen liberally and repeatedly throughout the day. You should seek shade when your shadow is shorter than you. Protect yourself by wearing long sleeves, pants, a wide-brimmed hat, and sunglasses year round, whenever you are outdoors.  Once a month, do a whole body skin exam, using a mirror to look at the skin on your back. Tell your health care provider of new moles, moles that have irregular borders, moles that are larger than a pencil eraser, or moles that have changed in shape or color.  Stay current with required vaccines (immunizations).  Influenza vaccine. All adults should be immunized every year.  Tetanus, diphtheria, and acellular pertussis (Td, Tdap) vaccine. Pregnant women should receive 1 dose of Tdap vaccine during each pregnancy. The dose should be obtained regardless of the length of time since the last dose. Immunization is preferred during the 27th-36th week of gestation. An adult who has not previously received Tdap or who does not know her vaccine status should receive 1 dose of Tdap. This initial dose should be followed by tetanus and diphtheria toxoids (Td) booster doses every 10 years. Adults with an unknown or incomplete history of completing a 3-dose immunization series with Td-containing vaccines should begin or complete a primary immunization series including a Tdap dose. Adults should receive a Td booster every 10 years.  Varicella vaccine. An adult without evidence of immunity to varicella should receive 2 doses or a second dose if she has previously received 1 dose. Pregnant females who do not have evidence of immunity should receive the first dose after pregnancy. This first dose should be obtained before leaving the health care facility. The second dose should be obtained 4-8 weeks after the first dose.  Human papillomavirus (HPV) vaccine. Females aged 13-26 years  who have not received the vaccine previously should obtain the 3-dose series. The vaccine is not recommended for use in pregnant females. However, pregnancy testing is not needed before receiving a dose. If a female is found to be pregnant after receiving a dose, no treatment is needed. In that case, the remaining doses should be delayed until after the pregnancy. Immunization is recommended for any person with an immunocompromised condition through the age of 26 years if she did not get any or all doses earlier. During the 3-dose series, the second dose should be obtained 4-8 weeks after the first dose. The third dose should be obtained 24 weeks after the first dose and 16 weeks after the second dose.  Zoster vaccine. One dose is recommended for adults aged 60 years or older unless certain conditions are present.  Measles, mumps, and rubella (  MMR) vaccine. Adults born before [birth year] generally are considered immune to measles and mumps. Adults born in [birth year] or later should have 1 or more doses of MMR vaccine unless there is a contraindication to the vaccine or there is laboratory evidence of immunity to each of the three diseases. A routine second dose of MMR vaccine should be obtained at least 28 days after the first dose for students attending postsecondary schools, health care workers, or international travelers. People who received inactivated measles vaccine or an unknown type of measles vaccine during 1963-1967 should receive 2 doses of MMR vaccine. People who received inactivated mumps vaccine or an unknown type of mumps vaccine before 1979 and are at high risk for mumps infection should consider immunization with 2 doses of MMR vaccine. For females of childbearing age, rubella immunity should be determined. If there is no evidence of immunity, females who are not pregnant should be vaccinated. If there is no evidence of immunity, females who are pregnant should delay immunization until after pregnancy.  Unvaccinated health care workers born before [birth year] who lack laboratory evidence of measles, mumps, or rubella immunity or laboratory confirmation of disease should consider measles and mumps immunization with 2 doses of MMR vaccine or rubella immunization with 1 dose of MMR vaccine.  Pneumococcal 13-valent conjugate (PCV13) vaccine. When indicated, a person who is uncertain of her immunization history and has no record of immunization should receive the PCV13 vaccine. An adult aged 39 years or older who has certain medical conditions and has not been previously immunized should receive 1 dose of PCV13 vaccine. This PCV13 should be followed with a dose of pneumococcal polysaccharide (PPSV23) vaccine. The PPSV23 vaccine dose should be obtained at least 8 weeks after the dose of PCV13 vaccine. An adult aged 62 years or older who has certain medical conditions and previously received 1 or more doses of PPSV23 vaccine should receive 1 dose of PCV13. The PCV13 vaccine dose should be obtained 1 or more years after the last PPSV23 vaccine dose.    Pneumococcal polysaccharide (PPSV23) vaccine. When PCV13 is also indicated, PCV13 should be obtained first. All adults aged 67 years and older should be immunized. An adult younger than age 45 years who has certain medical conditions should be immunized. Any person who resides in a nursing home or long-term care facility should be immunized. An adult smoker should be immunized. People with an immunocompromised condition and certain other conditions should receive both PCV13 and PPSV23 vaccines. People with human immunodeficiency virus (HIV) infection should be immunized as soon as possible after diagnosis. Immunization during chemotherapy or radiation therapy should be avoided. Routine use of PPSV23 vaccine is not recommended for American Indians, [place] Natives, or people younger than 65 years unless there are medical conditions that require PPSV23 vaccine. When indicated,  people who have unknown immunization and have no record of immunization should receive PPSV23 vaccine. One-time revaccination 5 years after the first dose of PPSV23 is recommended for people aged 19-64 years who have chronic kidney failure, nephrotic syndrome, asplenia, or immunocompromised conditions. People who received 1-2 doses of PPSV23 before age 23 years should receive another dose of PPSV23 vaccine at age 35 years or later if at least 5 years have passed since the previous dose. Doses of PPSV23 are not needed for people immunized with PPSV23 at or after age 38 years.  Preventive Services / Frequency   Ages 43 to 86 years  Blood pressure check.  Lipid and cholesterol check.  Lung  cancer screening. / Every year if you are aged 106-80 years and have a 30-pack-year history of smoking and currently smoke or have quit within the past 15 years. Yearly screening is stopped once you have quit smoking for at least 15 years or develop a health problem that would prevent you from having lung cancer treatment.  Clinical breast exam.** / Every year after age 68 years.  BRCA-related cancer risk assessment.** / For women who have family members with a BRCA-related cancer (breast, ovarian, tubal, or peritoneal cancers).  Mammogram.** / Every year beginning at age 97 years and continuing for as long as you are in good health. Consult with your health care provider.  Pap test.** / Every 3 years starting at age 4 years through age 67 or 11 years with a history of 3 consecutive normal Pap tests.  HPV screening.** / Every 3 years from ages 70 years through ages 45 to 38 years with a history of 3 consecutive normal Pap tests.  Fecal occult blood test (FOBT) of stool. / Every year beginning at age 35 years and continuing until age 61 years. You may not need to do this test if you get a colonoscopy every 10 years.  Flexible sigmoidoscopy or colonoscopy.** / Every 5 years for a flexible sigmoidoscopy or  every 10 years for a colonoscopy beginning at age 78 years and continuing until age 14 years.  Hepatitis C blood test.** / For all people born from [birth year] through 1965 and any individual with known risks for hepatitis C.  Skin self-exam. / Monthly.  Influenza vaccine. / Every year.  Tetanus, diphtheria, and acellular pertussis (Tdap/Td) vaccine.** / Consult your health care provider. Pregnant women should receive 1 dose of Tdap vaccine during each pregnancy. 1 dose of Td every 10 years.  Varicella vaccine.** / Consult your health care provider. Pregnant females who do not have evidence of immunity should receive the first dose after pregnancy.  Zoster vaccine.** / 1 dose for adults aged 30 years or older.  Pneumococcal 13-valent conjugate (PCV13) vaccine.** / Consult your health care provider.  Pneumococcal polysaccharide (PPSV23) vaccine.** / 1 to 2 doses if you smoke cigarettes or if you have certain conditions.  Meningococcal vaccine.** / Consult your health care provider.  Hepatitis A vaccine.** / Consult your health care provider.  Hepatitis B vaccine.** / Consult your health care provider. Screening for abdominal aortic aneurysm (AAA)  by ultrasound is recommended for people over 50 who have history of high blood pressure or who are current or former smokers.    Hepatitis C Hepatitis C is a viral infection of the liver. It can lead to scarring of the liver (cirrhosis), liver failure, or liver cancer. Hepatitis C may go undetected for months or years because people with the infection may not have symptoms, or they may have only mild symptoms. CAUSES  Hepatitis C is caused by the hepatitis C virus (HCV). The virus can be passed from one person to another through:  Blood.  Contaminated needles, such as those used for tattooing, body piercing, acupuncture, or injecting drugs.  Having unprotected sex with an infected person.  Childbirth.  Blood transfusions or organ transplants  done in the [place] before 1992. RISK FACTORS Risk factors for hepatitis C include:  Having unprotected sex with an infected person.  Using illegal drugs. SIGNS AND SYMPTOMS  Symptoms of hepatitis C may include:  Fatigue.  Loss of appetite.  Nausea.  Vomiting.  Abdominal pain.  Dark yellow urine.  Yellowish skin and eyes (jaundice).  Itching of the skin.  Clay-colored bowel movements.  Joint pain. Symptoms are not always present.  DIAGNOSIS  Hepatitis C is diagnosed with blood tests. Other types of tests may also be done to check how your liver is functioning. TREATMENT  Your health care provider may perform noninvasive tests or a liver biopsy to help determine the best course of treatment. Treatment for hepatitis C may include one or more medicines. Your health care provider may check you for a recurring infection or other liver conditions every 6-12 months after treatment. HOME CARE INSTRUCTIONS   Rest as needed.  Take all medicines as directed by your health care provider.  Do not take any medicine unless approved by your health care provider. This includes over-the-counter medicine and birth control pills.  Do not drink alcohol.  Do not have sex until approved by your health care provider.  Do not share toothbrushes, nail clippers, razors, or needles. PREVENTION There is no vaccine for hepatitis C. The only way to prevent the disease is to reduce the risk of exposure to the virus. This may be done by:  Practicing safe sex and using condoms.  Avoiding illegal drugs. SEEK MEDICAL CARE IF:  You have a fever.  You develop abdominal pain.  You develop dark urine.  You have clay-colored bowel movements.  You develop joint pains. SEEK IMMEDIATE MEDICAL CARE IF:  You have increasing fatigue or weakness.  You lose your appetite.  You feel nauseous or vomit.  You develop jaundice or your jaundice gets worse.  You bruise or bleed easily. MAKE  SURE YOU:   Understand these instructions.  Will watch your condition.  Will get help right away if you are not doing well or get worse.   This information is not intended to replace advice given to you by your health care provider. Make sure you discuss any questions you have with your health care provider.   Document Released: 01/18/2000 Document Revised: 02/10/2014 Document Reviewed: 05/04/2013 Elsevier Interactive Patient Education [employer].

## 2015-10-03 ENCOUNTER — Other Ambulatory Visit: Payer: Self-pay | Admitting: Physician Assistant

## 2015-10-03 DIAGNOSIS — R7989 Other specified abnormal findings of blood chemistry: Secondary | ICD-10-CM

## 2015-10-03 DIAGNOSIS — R945 Abnormal results of liver function studies: Principal | ICD-10-CM

## 2015-10-03 LAB — MICROALBUMIN / CREATININE URINE RATIO
CREATININE, URINE: 202 mg/dL (ref 20–320)
MICROALB/CREAT RATIO: 4 ug/mg{creat} (ref <30)
Microalb, Ur: 0.8 mg/dL

## 2015-10-03 LAB — URINALYSIS, ROUTINE W REFLEX MICROSCOPIC
Bilirubin Urine: NEGATIVE
GLUCOSE, UA: NEGATIVE
Hgb urine dipstick: NEGATIVE
Ketones, ur: NEGATIVE
LEUKOCYTES UA: NEGATIVE
Nitrite: NEGATIVE
PROTEIN: NEGATIVE
SPECIFIC GRAVITY, URINE: 1.018 (ref 1.001–1.035)
pH: 7.5 (ref 5.0–8.0)

## 2015-10-03 LAB — FERRITIN: Ferritin: 247 ng/mL — ABNORMAL HIGH (ref 10–232)

## 2015-10-17 ENCOUNTER — Other Ambulatory Visit: Payer: Self-pay | Admitting: Physician Assistant

## 2015-10-17 DIAGNOSIS — Z1231 Encounter for screening mammogram for malignant neoplasm of breast: Secondary | ICD-10-CM

## 2015-10-21 ENCOUNTER — Other Ambulatory Visit: Payer: Self-pay | Admitting: Internal Medicine

## 2015-11-12 ENCOUNTER — Other Ambulatory Visit: Payer: Self-pay | Admitting: Internal Medicine

## 2015-11-12 ENCOUNTER — Encounter: Payer: Self-pay | Admitting: Physician Assistant

## 2015-11-12 DIAGNOSIS — F419 Anxiety disorder, unspecified: Secondary | ICD-10-CM

## 2015-11-12 MED ORDER — OSPEMIFENE 60 MG PO TABS: ORAL_TABLET | ORAL | 0 refills | Status: DC

## 2015-11-12 MED ORDER — ALPRAZOLAM 0.5 MG PO TABS: 0.5000 mg | ORAL_TABLET | Freq: Two times a day (BID) | ORAL | 0 refills | Status: DC

## 2015-12-04 ENCOUNTER — Other Ambulatory Visit: Payer: Self-pay | Admitting: Physician Assistant

## 2015-12-04 DIAGNOSIS — F988 Other specified behavioral and emotional disorders with onset usually occurring in childhood and adolescence: Secondary | ICD-10-CM

## 2015-12-04 MED ORDER — AMPHETAMINE-DEXTROAMPHETAMINE 20 MG PO TABS: 20.0000 mg | ORAL_TABLET | Freq: Two times a day (BID) | ORAL | 0 refills | Status: DC

## 2015-12-24 ENCOUNTER — Other Ambulatory Visit: Payer: Self-pay | Admitting: Physician Assistant

## 2015-12-24 DIAGNOSIS — F419 Anxiety disorder, unspecified: Secondary | ICD-10-CM

## 2015-12-24 MED ORDER — ALPRAZOLAM 0.5 MG PO TABS: 0.5000 mg | ORAL_TABLET | Freq: Two times a day (BID) | ORAL | 0 refills | Status: DC

## 2016-01-02 ENCOUNTER — Other Ambulatory Visit: Payer: Self-pay | Admitting: Physician Assistant

## 2016-01-02 DIAGNOSIS — F419 Anxiety disorder, unspecified: Secondary | ICD-10-CM

## 2016-01-02 NOTE — Telephone Encounter (Signed)
Rx called into [facility] (Xanax)

## 2016-01-04 ENCOUNTER — Ambulatory Visit: Payer: PRIVATE HEALTH INSURANCE

## 2016-01-04 ENCOUNTER — Other Ambulatory Visit: Payer: PRIVATE HEALTH INSURANCE

## 2016-01-11 ENCOUNTER — Ambulatory Visit: Payer: Self-pay | Admitting: Physician Assistant

## 2016-01-21 ENCOUNTER — Ambulatory Visit (INDEPENDENT_AMBULATORY_CARE_PROVIDER_SITE_OTHER): Payer: Managed Care, Other (non HMO) | Admitting: *Deleted

## 2016-01-21 DIAGNOSIS — R300 Dysuria: Secondary | ICD-10-CM

## 2016-01-21 MED ORDER — CIPROFLOXACIN HCL 250 MG PO TABS: 250.0000 mg | ORAL_TABLET | Freq: Two times a day (BID) | ORAL | 0 refills | Status: AC

## 2016-01-21 NOTE — Progress Notes (Signed)
Patient presents with c/o increasing dysuria symptoms times 2 days.  Per Dr. Idell Pickles orders, patient will provide urine specimen for UA and UC and Cipro abx will be sent into pharmacy for relief from symptoms until UC resulted.

## 2016-01-22 LAB — URINALYSIS, ROUTINE W REFLEX MICROSCOPIC
Bilirubin Urine: NEGATIVE
GLUCOSE, UA: NEGATIVE
NITRITE: NEGATIVE
Specific Gravity, Urine: 1.023 (ref 1.001–1.035)
pH: 6 (ref 5.0–8.0)

## 2016-01-22 LAB — URINALYSIS, MICROSCOPIC ONLY
Casts: NONE SEEN [LPF]
Crystals: NONE SEEN [HPF]
YEAST: NONE SEEN [HPF]

## 2016-01-24 LAB — URINE CULTURE

## 2016-01-29 ENCOUNTER — Other Ambulatory Visit: Payer: Self-pay | Admitting: Physician Assistant

## 2016-01-29 DIAGNOSIS — F988 Other specified behavioral and emotional disorders with onset usually occurring in childhood and adolescence: Secondary | ICD-10-CM

## 2016-01-29 MED ORDER — AMPHETAMINE-DEXTROAMPHETAMINE 20 MG PO TABS: 20.0000 mg | ORAL_TABLET | Freq: Two times a day (BID) | ORAL | 0 refills | Status: DC

## 2016-02-13 ENCOUNTER — Other Ambulatory Visit: Payer: PRIVATE HEALTH INSURANCE

## 2016-02-13 ENCOUNTER — Ambulatory Visit: Payer: PRIVATE HEALTH INSURANCE

## 2016-02-18 ENCOUNTER — Ambulatory Visit: Payer: Self-pay | Admitting: Physician Assistant

## 2016-02-27 ENCOUNTER — Ambulatory Visit: Payer: PRIVATE HEALTH INSURANCE

## 2016-02-27 ENCOUNTER — Other Ambulatory Visit: Payer: PRIVATE HEALTH INSURANCE

## 2016-03-07 ENCOUNTER — Ambulatory Visit: Payer: Self-pay | Admitting: Physician Assistant

## 2016-03-14 ENCOUNTER — Other Ambulatory Visit: Payer: Self-pay | Admitting: Physician Assistant

## 2016-03-14 ENCOUNTER — Other Ambulatory Visit: Payer: Self-pay | Admitting: Internal Medicine

## 2016-03-14 DIAGNOSIS — F419 Anxiety disorder, unspecified: Secondary | ICD-10-CM

## 2016-03-14 DIAGNOSIS — F988 Other specified behavioral and emotional disorders with onset usually occurring in childhood and adolescence: Secondary | ICD-10-CM

## 2016-03-15 ENCOUNTER — Other Ambulatory Visit: Payer: Self-pay | Admitting: Internal Medicine

## 2016-03-15 DIAGNOSIS — F419 Anxiety disorder, unspecified: Secondary | ICD-10-CM

## 2016-03-15 MED ORDER — ALPRAZOLAM 0.5 MG PO TABS: ORAL_TABLET | ORAL | 0 refills | Status: DC

## 2016-03-15 MED ORDER — AMPHETAMINE-DEXTROAMPHETAMINE 20 MG PO TABS: 20.0000 mg | ORAL_TABLET | Freq: Two times a day (BID) | ORAL | 0 refills | Status: DC

## 2016-03-17 NOTE — Progress Notes (Signed)
Xanax was called into pharmacy @ 8:33am on 12th Feb 2018 by DD.

## 2016-04-09 ENCOUNTER — Ambulatory Visit (INDEPENDENT_AMBULATORY_CARE_PROVIDER_SITE_OTHER): Payer: Managed Care, Other (non HMO) | Admitting: Physician Assistant

## 2016-04-09 ENCOUNTER — Ambulatory Visit: Payer: Self-pay | Admitting: Physician Assistant

## 2016-04-09 ENCOUNTER — Encounter: Payer: Self-pay | Admitting: Physician Assistant

## 2016-04-09 VITALS — BP 124/62 | HR 113 | Temp 97.7°F | Resp 16 | Ht 62.5 in | Wt 121.4 lb

## 2016-04-09 DIAGNOSIS — G4700 Insomnia, unspecified: Secondary | ICD-10-CM

## 2016-04-09 DIAGNOSIS — F988 Other specified behavioral and emotional disorders with onset usually occurring in childhood and adolescence: Secondary | ICD-10-CM | POA: Diagnosis not present

## 2016-04-09 DIAGNOSIS — I10 Essential (primary) hypertension: Secondary | ICD-10-CM | POA: Diagnosis not present

## 2016-04-09 DIAGNOSIS — E785 Hyperlipidemia, unspecified: Secondary | ICD-10-CM

## 2016-04-09 DIAGNOSIS — B1920 Unspecified viral hepatitis C without hepatic coma: Secondary | ICD-10-CM

## 2016-04-09 LAB — CBC WITH DIFFERENTIAL/PLATELET
BASOS ABS: 0 {cells}/uL (ref 0–200)
Basophils Relative: 0 %
EOS PCT: 0 %
Eosinophils Absolute: 0 cells/uL — ABNORMAL LOW (ref 15–500)
HCT: 41.5 % (ref 35.0–45.0)
Hemoglobin: 14.3 g/dL (ref 11.7–15.5)
LYMPHS PCT: 28 %
Lymphs Abs: 1848 cells/uL (ref 850–3900)
MCH: 30.4 pg (ref 27.0–33.0)
MCHC: 34.5 g/dL (ref 32.0–36.0)
MCV: 88.3 fL (ref 80.0–100.0)
MONOS PCT: 7 %
MPV: 10.3 fL (ref 7.5–12.5)
Monocytes Absolute: 462 cells/uL (ref 200–950)
NEUTROS ABS: 4290 {cells}/uL (ref 1500–7800)
Neutrophils Relative %: 65 %
Platelets: 337 10*3/uL (ref 140–400)
RBC: 4.7 MIL/uL (ref 3.80–5.10)
RDW: 12.9 % (ref 11.0–15.0)
WBC: 6.6 10*3/uL (ref 3.8–10.8)

## 2016-04-09 LAB — BASIC METABOLIC PANEL WITH GFR
BUN: 16 mg/dL (ref 7–25)
CALCIUM: 10.3 mg/dL (ref 8.6–10.4)
CO2: 29 mmol/L (ref 20–31)
CREATININE: 0.69 mg/dL (ref 0.50–1.05)
Chloride: 96 mmol/L — ABNORMAL LOW (ref 98–110)
GFR, Est Non African American: >89 mL/min (ref >=60)
Glucose, Bld: 97 mg/dL (ref 65–99)
Potassium: 4.1 mmol/L (ref 3.5–5.3)
SODIUM: 137 mmol/L (ref 135–146)

## 2016-04-09 LAB — HEPATIC FUNCTION PANEL
ALBUMIN: 4.6 g/dL (ref 3.6–5.1)
ALT: 58 U/L — ABNORMAL HIGH (ref 6–29)
AST: 58 U/L — ABNORMAL HIGH (ref 10–35)
Alkaline Phosphatase: 98 U/L (ref 33–130)
BILIRUBIN DIRECT: 0.2 mg/dL (ref <=0.2)
BILIRUBIN TOTAL: 0.7 mg/dL (ref 0.2–1.2)
Indirect Bilirubin: 0.5 mg/dL (ref 0.2–1.2)
Total Protein: 7.7 g/dL (ref 6.1–8.1)

## 2016-04-09 MED ORDER — TRAZODONE HCL 50 MG PO TABS: ORAL_TABLET | ORAL | 2 refills | Status: DC

## 2016-04-09 MED ORDER — HEPATITIS A-HEP B RECOMB VAC 720-20 ELU-MCG/ML IM SUSP: 1.0000 mL | Freq: Once | INTRAMUSCULAR | 2 refills | Status: DC

## 2016-04-09 NOTE — Progress Notes (Signed)
Assessment and Plan:  Cholesterol -Continue diet and exercise. Check cholesterol.   Vitamin D Def - check level and continue medications.    Hepatitis C Was not treated due to insurance reasons 2-3 years ago Will repeat labs and refer to see if further can be done  Insomnia Try trazodone.   Continue diet and meds as discussed. Further disposition pending results of labs. Over 30 minutes of exam, counseling, chart review, and critical decision making was performed  Future Appointments Date Time Provider [place]  04/09/2016 11:30 AM Vicie Mutters, PA-C [facility] None  10/02/2016 9:00 AM Vicie Mutters, PA-C [facility] None     HPI 61 y.o. female  presents for 3 month follow up on hypertension, cholesterol, prediabetes, and vitamin D deficiency.   Her blood pressure has been controlled at home, she is on lisinopril, today their BP is     She does workout. She denies chest pain, shortness of breath, dizziness.  She has depression and ADD, on wellbutrin and adderall will normally take one occ two a day. Prescription normally last 1.5 months.  Will take xanax for sleep, willing to try other medications for sleep to help make xanax more AS NEEDED.  Has  New grandson, fin, due any day.    She is not on cholesterol medication and denies myalgias. Her cholesterol is at goal. The cholesterol last visit was:  Lab Results  Component Value Date   CHOL 267 (H) 10/02/2015   HDL 160 10/02/2015   LDLCALC 94 10/02/2015   TRIG 63 10/02/2015   CHOLHDL 1.7 10/02/2015   Patient is on Vitamin D supplement.   Lab Results  Component Value Date   VD25OH 51.9 10/02/2014      Current Medications:  Current Outpatient Prescriptions on File Prior to Visit  Medication Sig  . ALPRAZolam (XANAX) 0.5 MG tablet Take 1/2 to 1 tablet 1 to 2 x/ day only if needed for Anxiety attack  . amphetamine-dextroamphetamine (ADDERALL) 20 MG tablet Take 1 tablet (20 mg total) by mouth 2 (two) times daily.   Marland Kitchen buPROPion (WELLBUTRIN SR) 150 MG 12 hr tablet Take 1 tablet (150 mg total) by mouth 2 (two) times daily.  Marland Kitchen lisinopril-hydrochlorothiazide (PRINZIDE,ZESTORETIC) 20-25 MG tablet TAKE 1 TABLET BY MOUTH DAILY.  Marland Kitchen MILK THISTLE PO Take 1 tablet by mouth daily.  . Multiple Vitamin (MULTIVITAMIN WITH MINERALS) TABS tablet Take 1 tablet by mouth daily.  . Ospemifene (OSPHENA) 60 MG TABS 1 pill daily or every other day as directed  . Probiotic Product (PROBIOTIC DAILY PO) Take 1 tablet by mouth daily.   No current facility-administered medications on file prior to visit.     Medical History:  Past Medical History:  Diagnosis Date  . ADD (attention deficit disorder)   . Cancer [facility]) 2004   Breast   . Hepatitis C 09/2012   Genotype 1a, sent to [facility]  . Hyperlipidemia   . Hypertension    Allergies: No Known Allergies   Review of Systems:  Review of Systems  Constitutional: Negative.   HENT: Negative.   Eyes: Negative.   Respiratory: Negative.   Cardiovascular: Negative.   Gastrointestinal: Negative.   Genitourinary: Negative.   Musculoskeletal: Negative.   Skin: Negative.     Family history- Review and unchanged Social history- Review and unchanged Physical Exam: There were no vitals taken for this visit. Wt Readings from Last 3 Encounters:  10/02/15 118 lb (53.5 kg)  10/02/14 123 lb 9.6 oz (56.1 kg)  09/29/13  116 lb (52.6 kg)   General Appearance: Well nourished, in no apparent distress. Eyes: PERRLA, EOMs, conjunctiva no swelling or erythema Sinuses: No Frontal/maxillary tenderness ENT/Mouth: Ext aud canals clear, TMs without erythema, bulging. No erythema, swelling, or exudate on post pharynx.  Tonsils not swollen or erythematous. Hearing normal.  Neck: Supple, thyroid normal.  Respiratory: Respiratory effort normal, BS equal bilaterally without rales, rhonchi, wheezing or stridor.  Cardio: RRR with no MRGs. Brisk peripheral pulses without edema.  Abdomen: Soft, +  BS,  Non tender, no guarding, rebound, hernias, masses. Lymphatics: Non tender without lymphadenopathy.  Musculoskeletal: Full ROM, 5/5 strength, Normal gait Skin: Warm, dry without rashes, lesions, ecchymosis.  Neuro: Cranial nerves intact. Normal muscle tone, no cerebellar symptoms. Psych: Awake and oriented X 3, normal affect, Insight and Judgment appropriate.    Vicie Mutters, PA-C 9:40 AM [facility]

## 2016-04-10 ENCOUNTER — Other Ambulatory Visit: Payer: Self-pay | Admitting: Physician Assistant

## 2016-04-10 DIAGNOSIS — K7581 Nonalcoholic steatohepatitis (NASH): Secondary | ICD-10-CM

## 2016-04-10 DIAGNOSIS — B1920 Unspecified viral hepatitis C without hepatic coma: Secondary | ICD-10-CM

## 2016-04-11 LAB — HEPATITIS C RNA QUANTITATIVE
HCV QUANT: 3170000 [IU]/mL — AB
HCV Quantitative Log: 6.5 Log IU/mL — ABNORMAL HIGH

## 2016-04-30 ENCOUNTER — Ambulatory Visit: Payer: Self-pay | Admitting: Physician Assistant

## 2016-05-01 ENCOUNTER — Other Ambulatory Visit: Payer: Self-pay | Admitting: Internal Medicine

## 2016-05-01 DIAGNOSIS — F988 Other specified behavioral and emotional disorders with onset usually occurring in childhood and adolescence: Secondary | ICD-10-CM

## 2016-05-01 MED ORDER — AMPHETAMINE-DEXTROAMPHETAMINE 20 MG PO TABS: 20.0000 mg | ORAL_TABLET | Freq: Two times a day (BID) | ORAL | 0 refills | Status: DC

## 2016-05-28 ENCOUNTER — Encounter: Payer: PRIVATE HEALTH INSURANCE | Admitting: Internal Medicine

## 2016-05-29 ENCOUNTER — Encounter: Payer: Self-pay | Admitting: Physician Assistant

## 2016-05-29 DIAGNOSIS — F419 Anxiety disorder, unspecified: Secondary | ICD-10-CM

## 2016-05-29 MED ORDER — ALPRAZOLAM 0.5 MG PO TABS: ORAL_TABLET | ORAL | 0 refills | Status: DC

## 2016-06-06 ENCOUNTER — Other Ambulatory Visit (HOSPITAL_COMMUNITY): Payer: Self-pay | Admitting: Nurse Practitioner

## 2016-06-06 DIAGNOSIS — B182 Chronic viral hepatitis C: Secondary | ICD-10-CM

## 2016-06-18 ENCOUNTER — Other Ambulatory Visit: Payer: Self-pay | Admitting: Internal Medicine

## 2016-06-18 DIAGNOSIS — F988 Other specified behavioral and emotional disorders with onset usually occurring in childhood and adolescence: Secondary | ICD-10-CM

## 2016-06-18 MED ORDER — AMPHETAMINE-DEXTROAMPHETAMINE 20 MG PO TABS: 20.0000 mg | ORAL_TABLET | Freq: Two times a day (BID) | ORAL | 0 refills | Status: DC

## 2016-06-23 ENCOUNTER — Other Ambulatory Visit: Payer: Self-pay

## 2016-06-23 MED ORDER — LISINOPRIL-HYDROCHLOROTHIAZIDE 20-25 MG PO TABS
1.0000 | ORAL_TABLET | Freq: Every day | ORAL | 1 refills | Status: DC
Start: 2016-06-23 — End: 2016-11-22

## 2016-07-10 ENCOUNTER — Encounter: Payer: Self-pay | Admitting: Physician Assistant

## 2016-07-10 ENCOUNTER — Ambulatory Visit (HOSPITAL_COMMUNITY)
Admission: RE | Admit: 2016-07-10 | Discharge: 2016-07-10 | Disposition: A | Discharge status: Home / Self Care | Payer: Managed Care, Other (non HMO) | Source: Ambulatory Visit | Attending: Nurse Practitioner | Admitting: Nurse Practitioner

## 2016-07-10 ENCOUNTER — Other Ambulatory Visit: Payer: Self-pay | Admitting: Internal Medicine

## 2016-07-10 DIAGNOSIS — B182 Chronic viral hepatitis C: Secondary | ICD-10-CM | POA: Diagnosis present

## 2016-07-10 DIAGNOSIS — K760 Fatty (change of) liver, not elsewhere classified: Secondary | ICD-10-CM | POA: Diagnosis not present

## 2016-07-10 DIAGNOSIS — B1920 Unspecified viral hepatitis C without hepatic coma: Secondary | ICD-10-CM

## 2016-07-10 MED ORDER — HEPATITIS A-HEP B RECOMB VAC 720-20 ELU-MCG/ML IM SUSP: 1.0000 mL | Freq: Once | INTRAMUSCULAR | 2 refills | Status: AC

## 2016-07-25 ENCOUNTER — Ambulatory Visit
Admission: RE | Admit: 2016-07-25 | Discharge: 2016-07-25 | Disposition: A | Discharge status: Home / Self Care | Payer: Managed Care, Other (non HMO) | Source: Ambulatory Visit | Attending: Physician Assistant | Admitting: Physician Assistant

## 2016-07-25 DIAGNOSIS — M8580 Other specified disorders of bone density and structure, unspecified site: Secondary | ICD-10-CM

## 2016-07-25 DIAGNOSIS — Z1231 Encounter for screening mammogram for malignant neoplasm of breast: Secondary | ICD-10-CM

## 2016-07-25 HISTORY — DX: Personal history of irradiation: Z92.3

## 2016-07-25 HISTORY — DX: Malignant neoplasm of unspecified site of unspecified female breast: C50.919

## 2016-08-05 ENCOUNTER — Telehealth: Payer: Self-pay | Admitting: Physician Assistant

## 2016-08-05 DIAGNOSIS — F419 Anxiety disorder, unspecified: Secondary | ICD-10-CM

## 2016-08-05 MED ORDER — OSPEMIFENE 60 MG PO TABS: ORAL_TABLET | ORAL | 0 refills | Status: DC

## 2016-08-05 MED ORDER — ALPRAZOLAM 0.5 MG PO TABS: ORAL_TABLET | ORAL | 0 refills | Status: DC

## 2016-08-07 ENCOUNTER — Other Ambulatory Visit: Payer: Self-pay | Admitting: Physician Assistant

## 2016-08-07 DIAGNOSIS — F418 Other specified anxiety disorders: Secondary | ICD-10-CM

## 2016-08-11 ENCOUNTER — Other Ambulatory Visit: Payer: Self-pay | Admitting: Physician Assistant

## 2016-08-11 DIAGNOSIS — F988 Other specified behavioral and emotional disorders with onset usually occurring in childhood and adolescence: Secondary | ICD-10-CM

## 2016-08-11 MED ORDER — AMPHETAMINE-DEXTROAMPHETAMINE 20 MG PO TABS: 20.0000 mg | ORAL_TABLET | Freq: Two times a day (BID) | ORAL | 0 refills | Status: DC

## 2016-10-01 NOTE — Progress Notes (Deleted)
Complete Physical  Assessment and Plan: Essential hypertension -  DASH diet, exercise and monitor at home. Call if greater than 130/80.  - EKG 12-Lead  NASH (nonalcoholic steatohepatitis) Check labs, avoid tylenol, alcohol, weight loss advised.   Hepatitis C virus infection without hepatic coma, unspecified chronicity Wants appointment in Jan Will do vaccine for hep A/B   Hyperlipidemia -continue medications, check lipids, decrease fatty foods, increase activity.  Colitis No symptoms  Osteopenia Get DEXA   Breast Cancer history Get MGM Long discussion again about osphena use and history of breast cancer, patient understands risk but wishes to continue osphena use, only uses every other day  Depression with anxiety Continue wellbutrin   ADD (attention deficit disorder) Continue medication  Encounter for general adult medical examination with abnormal findings Gets labs at [facility] - EKG 12-Lead - Cytology - PAP  Medication management  Vitamin D deficiency Continue supplement   Discussed med's effects and SE's. Screening labs and tests as requested with regular follow-up as recommended.  HPI 61 y.o. female  presents for a complete physical.  Her blood pressure has not been controlled at home,  today he BP is   She does workout, but due to the heat has not worked out as much. She denies chest pain, shortness of breath, dizziness.  She is not on cholesterol medication and denies myalgias. Her cholesterol is at goal. The cholesterol last visit was:   Lab Results  Component Value Date   CHOL 267 (H) 10/02/2015   HDL 160 10/02/2015   LDLCALC 94 10/02/2015   TRIG 63 10/02/2015   CHOLHDL 1.7 10/02/2015   Lab Results  Component Value Date   HGBA1C 5.3 09/29/2013   Patient is on Vitamin D supplement.   Lab Results  Component Value Date   VD25OH 51.9 10/02/2014     She has a history of hepatitis C, has not had treatment yet, needs new referral. And she has a  history of NASH via CT AB in 2015.  Lab Results  Component Value Date   ALT 58 (H) 04/09/2016   AST 58 (H) 04/09/2016   ALKPHOS 98 04/09/2016   BILITOT 0.7 04/09/2016   Patient is on an ADD medication, she states that the medication is helping and she denies any adverse reactions.  She has a history of breast cancer, has implants, had normal MGM in July 3D. She has been on osphena for vaginal dryness and states that it has done well.  She is on wellbutrin and xanax for anxiety/depression.  Married with 1 son, works at [employer] but company got bought out.    Current Medications:  Current Outpatient Prescriptions on File Prior to Visit  Medication Sig Dispense Refill  . amphetamine-dextroamphetamine (ADDERALL) 20 MG tablet Take 1 tablet (20 mg total) by mouth 2 (two) times daily. 60 tablet 0  . buPROPion (WELLBUTRIN SR) 150 MG 12 hr tablet TAKE 1 TABLET BY MOUTH 2 TIMES DAILY 180 tablet 0  . lisinopril-hydrochlorothiazide (PRINZIDE,ZESTORETIC) 20-25 MG tablet Take 1 tablet by mouth daily. 90 tablet 1  . MILK THISTLE PO Take 1 tablet by mouth daily.    . Multiple Vitamin (MULTIVITAMIN WITH MINERALS) TABS tablet Take 1 tablet by mouth daily.    . Ospemifene (OSPHENA) 60 MG TABS 1 pill daily or every other day as directed 90 tablet 0  . Probiotic Product (PROBIOTIC DAILY PO) Take 1 tablet by mouth daily.    . traZODone (DESYREL) 50 MG tablet 1/2-1 tablet for  sleep 30 tablet 2   No current facility-administered medications on file prior to visit.    Health Maintenance:   Immunization History  Administered Date(s) Administered  . Tdap 09/19/2008   Tetanus: 2010 Pneumovax: Prenvar 13:  Flu vaccine: 2016 at work Zostavax: Pap: 2016 history of LEEP in 1994, ASCUS in 2010 sees Dr. Matthew Saras 3DMGM: 12/2014 with implants DEXA: 2012 DUE Colonoscopy: 09/25/2013- with Dr. Alice Reichert  EGD: CTAB 2015: NASH and Colitis Echo 2011: normal EF 60-65%  Eye: Dr. Gershon Crane has appointment coming  up Dentist: Dr. Benito Mccreedy  Medical History:  Past Medical History:  Diagnosis Date  . ADD (attention deficit disorder)   . Breast cancer ([place]) 2004   rt breast  . Cancer ([place]) 2004   Breast   . Hepatitis C 09/2012   Genotype 1a, sent to [facility]  . Hyperlipidemia   . Hypertension   . Personal history of radiation therapy 2004   rt breast  Allergies No Known Allergies  SURGICAL HISTORY She  has a past surgical history that includes LEEP (1994); Tubal ligation (2004); Ovarian cyst removal (Left, 2004); Augmentation mammaplasty (Bilateral, 2007); and Breast lumpectomy (Right, 2004). FAMILY HISTORY Her family history includes Cancer in her mother and sister; Hypertension in her father; Stroke in her father. SOCIAL HISTORY She  reports that she quit smoking about 28 years ago. Her smoking use included Cigarettes. She has never used smokeless tobacco. She reports that she drinks alcohol. She reports that she does not use drugs.   Review of Systems  Constitutional: Negative.   HENT: Negative.   Eyes: Negative.   Respiratory: Negative.   Cardiovascular: Negative.   Gastrointestinal: Negative.   Genitourinary: Negative.   Musculoskeletal: Negative.   Skin: Negative.   Neurological: Negative.   Endo/Heme/Allergies: Negative.   Psychiatric/Behavioral: Negative.    Physical Exam: Estimated body mass index is 21.85 kg/m as calculated from the following:   Height as of 04/09/16: 5' 2.5" (1.588 m).   Weight as of 04/09/16: 121 lb 6.4 oz (55.1 kg). There were no vitals taken for this visit. General Appearance: Well nourished, in no apparent distress. Eyes: PERRLA, EOMs, conjunctiva no swelling or erythema, normal fundi and vessels. Sinuses: No Frontal/maxillary tenderness ENT/Mouth: Ext aud canals clear, normal light reflex with TMs without erythema, bulging.  Good dentition. No erythema, swelling, or exudate on post pharynx. Tonsils not swollen or erythematous. Hearing normal.  Neck:  Supple, thyroid normal. No bruits Respiratory: Respiratory effort normal, BS equal bilaterally without rales, rhonchi, wheezing or stridor. Cardio: RRR without murmurs, rubs or gallops. Brisk peripheral pulses without edema.  Chest: symmetric, with normal excursions and percussion. Breasts: patient with bilateral implants, has scar/indention right breast at 12 o clock unchanged, no nipple retraction, discharge, lumps or masses Abdomen: Soft, +BS. Non tender, no guarding, rebound, hernias, masses, or organomegaly.  Lymphatics: Non tender without lymphadenopathy.  Genitourinary: defer Musculoskeletal: Full ROM all peripheral extremities,5/5 strength, and normal gait. Skin: Warm, dry without rashes, lesions, ecchymosis.  Neuro: Cranial nerves intact, reflexes equal bilaterally. Normal muscle tone, no cerebellar symptoms. Sensation intact.  Psych: Awake and oriented X 3, normal affect, Insight and Judgment appropriate.    Vicie Mutters 7:36 AM

## 2016-10-02 ENCOUNTER — Encounter: Payer: Self-pay | Admitting: Physician Assistant

## 2016-10-06 NOTE — Progress Notes (Signed)
Complete Physical  Assessment and Plan: Essential hypertension -  DASH diet, exercise and monitor at home. Call if greater than 130/80.  - EKG 12-Lead  NASH (nonalcoholic steatohepatitis) Check labs, avoid tylenol, alcohol, weight loss advised.   Hepatitis C virus infection without hepatic coma, unspecified chronicity S/p treatment, has follow up   Hyperlipidemia -continue medications, check lipids, decrease fatty foods, increase activity.  Colitis No symptoms  Osteoporosis Get DEXA 2 years Will start on fosamax  Breast Cancer history Get MGM Long discussion again about osphena use and history of breast cancer, patient understands risk but wishes to continue osphena use, only uses every other day  Depression with anxiety Continue wellbutrin   ADD (attention deficit disorder) Continue medication  Encounter for general adult medical examination with abnormal findings - EKG 12-Lead - PAP next year  Medication management  Vitamin D deficiency Continue supplement  Elevated ferritin -     Ferritin -     Iron,Total/Total Iron Binding Cap - may need to return for labs pending results  Insomnia, unspecified type -     traZODone (DESYREL) 50 MG tablet; 1-3 tablets for sleep - discussed getting off benzo with patient  Chronic anxiety -     ALPRAZolam (XANAX) 0.5 MG tablet; Take 1/2 to 1 tablet 1 to 2 x/ day only if needed for Anxiety attack   Discussed med's effects and SE's. Screening labs and tests as requested with regular follow-up as recommended.  HPI 61 y.o. female  presents for a complete physical.  Her blood pressure has not been controlled at home,  today he BP is BP: 122/80 She does workout, but due to the heat has not worked out as much. She denies chest pain, shortness of breath, dizziness.  New grandson Fin 57 months old She is not on cholesterol medication and denies myalgias. Her cholesterol is at goal. The cholesterol last visit was:   Lab Results   Component Value Date   CHOL 267 (H) 10/02/2015   HDL 160 10/02/2015   LDLCALC 94 10/02/2015   TRIG 63 10/02/2015   CHOLHDL 1.7 10/02/2015   Lab Results  Component Value Date   HGBA1C 5.3 09/29/2013   Patient is on Vitamin D supplement, on 5000 IU daily.    Lab Results  Component Value Date   VD25OH 51.9 10/02/2014     She has a history of hepatitis C, has completed treatment with harvoni, goes back 1 month for review. And she has a history of NASH via CT AB in 2015.  Lab Results  Component Value Date   ALT 58 (H) 04/09/2016   AST 58 (H) 04/09/2016   ALKPHOS 98 04/09/2016   BILITOT 0.7 04/09/2016   Patient is on an ADD medication, she states that the medication is helping and she denies any adverse reactions.  She has a history of breast cancer, has implants. She has been on osphena for vaginal dryness and states that it has done well.  She is on wellbutrin and xanax for anxiety/depression.  Married with 1 son, works at [employer] but company got bought out.    Current Medications:  Current Outpatient Prescriptions on File Prior to Visit  Medication Sig Dispense Refill  . amphetamine-dextroamphetamine (ADDERALL) 20 MG tablet Take 1 tablet (20 mg total) by mouth 2 (two) times daily. 60 tablet 0  . buPROPion (WELLBUTRIN SR) 150 MG 12 hr tablet TAKE 1 TABLET BY MOUTH 2 TIMES DAILY 180 tablet 0  . lisinopril-hydrochlorothiazide (PRINZIDE,ZESTORETIC) 20-25 MG  tablet Take 1 tablet by mouth daily. 90 tablet 1  . MILK THISTLE PO Take 1 tablet by mouth daily.    . Multiple Vitamin (MULTIVITAMIN WITH MINERALS) TABS tablet Take 1 tablet by mouth daily.    . Ospemifene (OSPHENA) 60 MG TABS 1 pill daily or every other day as directed 90 tablet 0  . Probiotic Product (PROBIOTIC DAILY PO) Take 1 tablet by mouth daily.     No current facility-administered medications on file prior to visit.    Health Maintenance:   Immunization History  Administered Date(s) Administered  . Tdap 09/19/2008    Tetanus: 2010 Pneumovax: N/A Prenvar 13: N/A Flu vaccine: 2017 at work Zostavax: pending Hep A&B: has had 2 of the series.   Pap: 2016 normal history of LEEP in 1994, ASCUS in 2010 DUE 2019 3DMGM: 07/2016 with implants DEXA: 07/2016 osteoporosis, started on fosamax Colonoscopy: 09/25/2013- with Dr. Alice Reichert  EGD: CTAB 2015: NASH and Colitis Echo 2011: normal EF 60-65%  Eye: Dr. Gershon Crane has appointment coming up Dentist: Dr. Benito Mccreedy  Medical History:  Past Medical History:  Diagnosis Date  . ADD (attention deficit disorder)   . Breast cancer ([place]) 2004   rt breast  . Cancer ([place]) 2004   Breast   . Hepatitis C 09/2012   Genotype 1a, sent to [facility]  . Hyperlipidemia   . Hypertension   . Personal history of radiation therapy 2004   rt breast   Allergies No Known Allergies  SURGICAL HISTORY She  has a past surgical history that includes LEEP (1994); Tubal ligation (2004); Ovarian cyst removal (Left, 2004); Augmentation mammaplasty (Bilateral, 2007); and Breast lumpectomy (Right, 2004). FAMILY HISTORY Her family history includes Cancer in her mother and sister; Hypertension in her father; Stroke in her father. SOCIAL HISTORY She  reports that she quit smoking about 28 years ago. Her smoking use included Cigarettes. She has never used smokeless tobacco. She reports that she drinks alcohol. She reports that she does not use drugs.   Review of Systems  Constitutional: Negative.   HENT: Negative.   Eyes: Negative.   Respiratory: Negative.   Cardiovascular: Negative.   Gastrointestinal: Negative.   Genitourinary: Negative.   Musculoskeletal: Negative.   Skin: Negative.   Neurological: Negative.   Endo/Heme/Allergies: Negative.   Psychiatric/Behavioral: Negative.    Physical Exam: Estimated body mass index is 21.9 kg/m as calculated from the following:   Height as of this encounter: 5' 1.5" (1.562 m).   Weight as of this encounter: 117 lb 12.8 oz (53.4 kg). BP  122/80   Pulse 90   Temp (!) 97.1 F (36.2 C)   Resp 18   Ht 5' 1.5" (1.562 m)   Wt 117 lb 12.8 oz (53.4 kg)   SpO2 97%   BMI 21.90 kg/m  General Appearance: Well nourished, in no apparent distress. Eyes: PERRLA, EOMs, conjunctiva no swelling or erythema, normal fundi and vessels. Sinuses: No Frontal/maxillary tenderness ENT/Mouth: Ext aud canals clear, normal light reflex with TMs without erythema, bulging.  Good dentition. No erythema, swelling, or exudate on post pharynx. Tonsils not swollen or erythematous. Hearing normal.  Neck: Supple, thyroid normal. No bruits Respiratory: Respiratory effort normal, BS equal bilaterally without rales, rhonchi, wheezing or stridor. Cardio: RRR without murmurs, rubs or gallops. Brisk peripheral pulses without edema.  Chest: symmetric, with normal excursions and percussion. Breasts: patient with bilateral implants, has scar/indention right breast at 12 o clock unchanged, no nipple retraction, discharge, lumps or masses Abdomen: Soft, +  BS. Non tender, no guarding, rebound, hernias, masses, or organomegaly.  Lymphatics: Non tender without lymphadenopathy.  Genitourinary: defer Musculoskeletal: Full ROM all peripheral extremities,5/5 strength, and normal gait. Skin: Warm, dry without rashes, lesions, ecchymosis.  Neuro: Cranial nerves intact, reflexes equal bilaterally. Normal muscle tone, no cerebellar symptoms. Sensation intact.  Psych: Awake and oriented X 3, normal affect, Insight and Judgment appropriate.    Vicie Mutters 3:30 PM

## 2016-10-07 ENCOUNTER — Encounter: Payer: Self-pay | Admitting: Physician Assistant

## 2016-10-07 ENCOUNTER — Ambulatory Visit (INDEPENDENT_AMBULATORY_CARE_PROVIDER_SITE_OTHER): Payer: Managed Care, Other (non HMO) | Admitting: Physician Assistant

## 2016-10-07 VITALS — BP 122/80 | HR 90 | Temp 97.1°F | Resp 18 | Ht 61.5 in | Wt 117.8 lb

## 2016-10-07 DIAGNOSIS — F988 Other specified behavioral and emotional disorders with onset usually occurring in childhood and adolescence: Secondary | ICD-10-CM

## 2016-10-07 DIAGNOSIS — Z0001 Encounter for general adult medical examination with abnormal findings: Secondary | ICD-10-CM | POA: Diagnosis not present

## 2016-10-07 DIAGNOSIS — F418 Other specified anxiety disorders: Secondary | ICD-10-CM | POA: Diagnosis not present

## 2016-10-07 DIAGNOSIS — I10 Essential (primary) hypertension: Secondary | ICD-10-CM

## 2016-10-07 DIAGNOSIS — Z79899 Other long term (current) drug therapy: Secondary | ICD-10-CM

## 2016-10-07 DIAGNOSIS — E559 Vitamin D deficiency, unspecified: Secondary | ICD-10-CM

## 2016-10-07 DIAGNOSIS — G4700 Insomnia, unspecified: Secondary | ICD-10-CM

## 2016-10-07 DIAGNOSIS — R7989 Other specified abnormal findings of blood chemistry: Secondary | ICD-10-CM

## 2016-10-07 DIAGNOSIS — F419 Anxiety disorder, unspecified: Secondary | ICD-10-CM

## 2016-10-07 DIAGNOSIS — K7581 Nonalcoholic steatohepatitis (NASH): Secondary | ICD-10-CM

## 2016-10-07 DIAGNOSIS — M810 Age-related osteoporosis without current pathological fracture: Secondary | ICD-10-CM

## 2016-10-07 DIAGNOSIS — K529 Noninfective gastroenteritis and colitis, unspecified: Secondary | ICD-10-CM

## 2016-10-07 DIAGNOSIS — Z853 Personal history of malignant neoplasm of breast: Secondary | ICD-10-CM | POA: Diagnosis not present

## 2016-10-07 DIAGNOSIS — Z136 Encounter for screening for cardiovascular disorders: Secondary | ICD-10-CM | POA: Diagnosis not present

## 2016-10-07 DIAGNOSIS — R6889 Other general symptoms and signs: Secondary | ICD-10-CM

## 2016-10-07 DIAGNOSIS — E785 Hyperlipidemia, unspecified: Secondary | ICD-10-CM | POA: Diagnosis not present

## 2016-10-07 DIAGNOSIS — B1920 Unspecified viral hepatitis C without hepatic coma: Secondary | ICD-10-CM

## 2016-10-07 MED ORDER — ALPRAZOLAM 0.5 MG PO TABS: ORAL_TABLET | ORAL | 1 refills | Status: DC

## 2016-10-07 MED ORDER — AMPHETAMINE-DEXTROAMPHETAMINE 20 MG PO TABS: 20.0000 mg | ORAL_TABLET | Freq: Two times a day (BID) | ORAL | 0 refills | Status: DC

## 2016-10-07 MED ORDER — TRAZODONE HCL 50 MG PO TABS: ORAL_TABLET | ORAL | 0 refills | Status: DC

## 2016-10-07 MED ORDER — ALENDRONATE SODIUM 70 MG PO TABS: 70.0000 mg | ORAL_TABLET | ORAL | 3 refills | Status: DC

## 2016-10-07 NOTE — Patient Instructions (Addendum)
The trazadone you can go up to 155m or 3 at night for sleep Can take with 1/2 of your xanax at first with goal to taper off the xanax  New guidelines suggest the benzodiazepines are best short term, with prolonged use they lead to physical and psychological dependence. In addition, evidence suggest that for insomnia the effectiveness wanes in 4 weeks and the risks out weight their benefits. Use of these agents have been associated with dementia, falls, motor vehicle accidents and physical addiction. Decreasing these medication have been proven to show improvements in cognition, alertness, decrease of falls and daytime sedation.   We will start a slow taper, symptoms of withdrawal include, insomnia, anxiety, irritability, sweating and stomach or intestinal symptoms like diarrhea or nausea.   Start on fosamax as we discussed  Osteoporosis Osteoporosis is the thinning and loss of density in the bones. Osteoporosis makes the bones more brittle, fragile, and likely to break (fracture). Over time, osteoporosis can cause the bones to become so weak that they fracture after a simple fall. The bones most likely to fracture are the bones in the hip, wrist, and spine. What are the causes? The exact cause is not known. What increases the risk? Anyone can develop osteoporosis. You may be at greater risk if you have a family history of the condition or have poor nutrition. You may also have a higher risk if you are:  Female.  525years old or older.  A smoker.  Not physically active.  White or Asian.  Slender.  What are the signs or symptoms? A fracture might be the first sign of the disease, especially if it results from a fall or injury that would not usually cause a bone to break. Other signs and symptoms include:  Low back and neck pain.  Stooped posture.  Height loss.  How is this diagnosed? To make a diagnosis, your health care provider may:  Take a medical history.  Perform a  physical exam.  Order tests, such as: ? A bone mineral density test. ? A dual-energy X-ray absorptiometry test.  How is this treated? The goal of osteoporosis treatment is to strengthen your bones to reduce your risk of a fracture. Treatment may involve:  Making lifestyle changes, such as: ? Eating a diet rich in calcium. ? Doing weight-bearing and muscle-strengthening exercises. ? Stopping tobacco use. ? Limiting alcohol intake.  Taking medicine to slow the process of bone loss or to increase bone density.  Monitoring your levels of calcium and vitamin D.  Follow these instructions at home:  Include calcium and vitamin D in your diet. Calcium is important for bone health, and vitamin D helps the body absorb calcium.  Perform weight-bearing and muscle-strengthening exercises as directed by your health care provider.  Do not use any tobacco products, including cigarettes, chewing tobacco, and electronic cigarettes. If you need help quitting, ask your health care provider.  Limit your alcohol intake.  Take medicines only as directed by your health care provider.  Keep all follow-up visits as directed by your health care provider. This is important.  Take precautions at home to lower your risk of falling, such as: ? Keeping rooms well lit and clutter free. ? Installing safety rails on stairs. ? Using rubber mats in the bathroom and other areas that are often wet or slippery. Get help right away if: You fall or injure yourself. This information is not intended to replace advice given to you by your health care provider. Make  sure you discuss any questions you have with your health care provider. Document Released: 10/30/2004 Document Revised: 06/25/2015 Document Reviewed: 06/30/2013 Elsevier Interactive Patient Education  2017 [employer].

## 2016-10-09 LAB — HEPATIC FUNCTION PANEL
AG RATIO: 1.8 (calc) (ref 1.0–2.5)
ALKALINE PHOSPHATASE (APISO): 85 U/L (ref 33–130)
ALT: 16 U/L (ref 6–29)
AST: 22 U/L (ref 10–35)
Albumin: 4.8 g/dL (ref 3.6–5.1)
BILIRUBIN DIRECT: 0.1 mg/dL (ref 0.0–0.2)
BILIRUBIN INDIRECT: 0.5 mg/dL (ref 0.2–1.2)
Globulin: 2.7 g/dL (calc) (ref 1.9–3.7)
TOTAL PROTEIN: 7.5 g/dL (ref 6.1–8.1)
Total Bilirubin: 0.6 mg/dL (ref 0.2–1.2)

## 2016-10-09 LAB — LIPID PANEL
CHOL/HDL RATIO: 2.4 (calc) (ref <5.0)
Cholesterol: 281 mg/dL — ABNORMAL HIGH (ref <200)
HDL: 119 mg/dL (ref >50)
LDL Cholesterol (Calc): 144 mg/dL (calc) — ABNORMAL HIGH
NON-HDL CHOLESTEROL (CALC): 162 mg/dL — AB (ref <130)
Triglycerides: 81 mg/dL (ref <150)

## 2016-10-09 LAB — CBC WITH DIFFERENTIAL/PLATELET
BASOS PCT: 0.3 %
Basophils Absolute: 23 cells/uL (ref 0–200)
Eosinophils Absolute: 60 cells/uL (ref 15–500)
Eosinophils Relative: 0.8 %
HEMATOCRIT: 40.4 % (ref 35.0–45.0)
Hemoglobin: 13.8 g/dL (ref 11.7–15.5)
Lymphs Abs: 1658 cells/uL (ref 850–3900)
MCH: 30.3 pg (ref 27.0–33.0)
MCHC: 34.2 g/dL (ref 32.0–36.0)
MCV: 88.8 fL (ref 80.0–100.0)
MPV: 10.6 fL (ref 7.5–12.5)
Monocytes Relative: 7.6 %
NEUTROS PCT: 69.2 %
Neutro Abs: 5190 cells/uL (ref 1500–7800)
Platelets: 285 10*3/uL (ref 140–400)
RBC: 4.55 10*6/uL (ref 3.80–5.10)
RDW: 12 % (ref 11.0–15.0)
Total Lymphocyte: 22.1 %
WBC mixed population: 570 cells/uL (ref 200–950)
WBC: 7.5 10*3/uL (ref 3.8–10.8)

## 2016-10-09 LAB — URINALYSIS W MICROSCOPIC + REFLEX CULTURE
BILIRUBIN URINE: NEGATIVE
Bacteria, UA: NONE SEEN /HPF
GLUCOSE, UA: NEGATIVE
HYALINE CAST: NONE SEEN /LPF
Hgb urine dipstick: NEGATIVE
Ketones, ur: NEGATIVE
NITRITES URINE, INITIAL: NEGATIVE
PH: 6 (ref 5.0–8.0)
PROTEIN: NEGATIVE
RBC / HPF: NONE SEEN /HPF (ref 0–2)
Specific Gravity, Urine: 1.022 (ref 1.001–1.03)
Squamous Epithelial / LPF: NONE SEEN /HPF (ref <=5)

## 2016-10-09 LAB — BASIC METABOLIC PANEL WITHOUT GFR
BUN: 22 mg/dL (ref 7–25)
CO2: 29 mmol/L (ref 20–32)
Calcium: 9.8 mg/dL (ref 8.6–10.4)
Chloride: 98 mmol/L (ref 98–110)
Creat: 0.85 mg/dL (ref 0.50–0.99)
GFR, Est African American: 86 mL/min/1.73m2
GFR, Est Non African American: 74 mL/min/1.73m2
Glucose, Bld: 86 mg/dL (ref 65–99)
Potassium: 4.2 mmol/L (ref 3.5–5.3)
Sodium: 138 mmol/L (ref 135–146)

## 2016-10-09 LAB — IRON, TOTAL/TOTAL IRON BINDING CAP
%SAT: 24 % (ref 11–50)
Iron: 94 ug/dL (ref 45–160)
TIBC: 395 ug/dL (ref 250–450)

## 2016-10-09 LAB — URINE CULTURE
MICRO NUMBER:: 80972374
RESULT: NO GROWTH
SPECIMEN QUALITY:: ADEQUATE

## 2016-10-09 LAB — TSH: TSH: 1.52 mIU/L (ref 0.40–4.50)

## 2016-10-09 LAB — MAGNESIUM: Magnesium: 2 mg/dL (ref 1.5–2.5)

## 2016-10-09 LAB — MICROALBUMIN / CREATININE URINE RATIO
Creatinine, Urine: 180 mg/dL (ref 20–320)
MICROALB/CREAT RATIO: 4 ug/mg{creat} (ref <30)
Microalb, Ur: 0.7 mg/dL

## 2016-10-09 LAB — CULTURE INDICATED

## 2016-10-09 LAB — VITAMIN D 25 HYDROXY (VIT D DEFICIENCY, FRACTURES): Vit D, 25-Hydroxy: 50 ng/mL (ref 30–100)

## 2016-10-09 LAB — FERRITIN: Ferritin: 187 ng/mL (ref 20–288)

## 2016-11-12 ENCOUNTER — Other Ambulatory Visit: Payer: Self-pay

## 2016-11-12 DIAGNOSIS — G4700 Insomnia, unspecified: Secondary | ICD-10-CM

## 2016-11-12 MED ORDER — TRAZODONE HCL 50 MG PO TABS: ORAL_TABLET | ORAL | 0 refills | Status: DC

## 2016-11-22 ENCOUNTER — Other Ambulatory Visit: Payer: Self-pay | Admitting: Physician Assistant

## 2016-12-01 ENCOUNTER — Other Ambulatory Visit: Payer: Self-pay | Admitting: Physician Assistant

## 2016-12-01 DIAGNOSIS — F988 Other specified behavioral and emotional disorders with onset usually occurring in childhood and adolescence: Secondary | ICD-10-CM

## 2016-12-01 MED ORDER — AMPHETAMINE-DEXTROAMPHETAMINE 20 MG PO TABS: 20.0000 mg | ORAL_TABLET | Freq: Two times a day (BID) | ORAL | 0 refills | Status: DC

## 2016-12-31 ENCOUNTER — Encounter: Payer: Self-pay | Admitting: Physician Assistant

## 2017-01-01 ENCOUNTER — Other Ambulatory Visit: Payer: Self-pay | Admitting: Physician Assistant

## 2017-01-01 DIAGNOSIS — F419 Anxiety disorder, unspecified: Secondary | ICD-10-CM

## 2017-01-01 MED ORDER — ALPRAZOLAM 0.5 MG PO TABS
ORAL_TABLET | ORAL | 0 refills | Status: AC
Start: 2017-01-01 — End: 2017-01-29

## 2017-01-01 MED ORDER — BUPROPION HCL ER (XL) 300 MG PO TB24: 300.0000 mg | ORAL_TABLET | ORAL | 1 refills | Status: DC

## 2017-01-01 NOTE — Progress Notes (Signed)
Rx called in 

## 2017-01-02 ENCOUNTER — Other Ambulatory Visit: Payer: Self-pay | Admitting: Plastic Surgery

## 2017-01-21 ENCOUNTER — Ambulatory Visit: Payer: Self-pay | Admitting: Physician Assistant

## 2017-01-23 ENCOUNTER — Encounter: Payer: Self-pay | Admitting: Physician Assistant

## 2017-01-23 DIAGNOSIS — F988 Other specified behavioral and emotional disorders with onset usually occurring in childhood and adolescence: Secondary | ICD-10-CM

## 2017-01-28 MED ORDER — AMPHETAMINE-DEXTROAMPHETAMINE 20 MG PO TABS: 20.0000 mg | ORAL_TABLET | Freq: Two times a day (BID) | ORAL | 0 refills | Status: DC

## 2017-02-09 ENCOUNTER — Encounter: Payer: Self-pay | Admitting: *Deleted

## 2017-02-09 NOTE — Progress Notes (Signed)
On 02-09-17 fax medical records to [facility] , it was consult note, end of tx note, sim & planning note, and dos will do there part

## 2017-04-06 ENCOUNTER — Other Ambulatory Visit: Payer: Self-pay | Admitting: Physician Assistant

## 2017-04-06 DIAGNOSIS — F988 Other specified behavioral and emotional disorders with onset usually occurring in childhood and adolescence: Secondary | ICD-10-CM

## 2017-04-07 MED ORDER — AMPHETAMINE-DEXTROAMPHETAMINE 20 MG PO TABS: 20.0000 mg | ORAL_TABLET | Freq: Two times a day (BID) | ORAL | 0 refills | Status: DC

## 2017-04-07 NOTE — Telephone Encounter (Signed)
Patient's Rx has been called into the pharmacy. & pt has been informed to pick up RX at the pharmacy. March 5th 2019 by DD

## 2017-04-20 ENCOUNTER — Ambulatory Visit: Payer: Self-pay | Admitting: Physician Assistant

## 2017-05-06 ENCOUNTER — Other Ambulatory Visit: Payer: Self-pay | Admitting: Internal Medicine

## 2017-05-06 MED ORDER — AZITHROMYCIN 250 MG PO TABS: ORAL_TABLET | ORAL | 0 refills | Status: DC

## 2017-05-06 MED ORDER — PREDNISONE 20 MG PO TABS: ORAL_TABLET | ORAL | 0 refills | Status: DC

## 2017-05-06 MED ORDER — PROMETHAZINE-DM 6.25-15 MG/5ML PO SYRP: ORAL_SOLUTION | ORAL | 0 refills | Status: DC

## 2017-05-11 ENCOUNTER — Encounter: Payer: Self-pay | Admitting: Internal Medicine

## 2017-05-11 ENCOUNTER — Ambulatory Visit (INDEPENDENT_AMBULATORY_CARE_PROVIDER_SITE_OTHER): Payer: Managed Care, Other (non HMO) | Admitting: Internal Medicine

## 2017-05-11 ENCOUNTER — Telehealth: Payer: Self-pay | Admitting: Physician Assistant

## 2017-05-11 VITALS — BP 152/94 | HR 104 | Temp 97.5°F | Resp 16 | Ht 61.5 in | Wt 121.8 lb

## 2017-05-11 DIAGNOSIS — J0410 Acute tracheitis without obstruction: Secondary | ICD-10-CM

## 2017-05-11 DIAGNOSIS — F419 Anxiety disorder, unspecified: Secondary | ICD-10-CM

## 2017-05-11 DIAGNOSIS — J4541 Moderate persistent asthma with (acute) exacerbation: Secondary | ICD-10-CM | POA: Diagnosis not present

## 2017-05-11 MED ORDER — BENZONATATE 200 MG PO CAPS: ORAL_CAPSULE | ORAL | 1 refills | Status: DC

## 2017-05-11 MED ORDER — LEVOFLOXACIN 500 MG PO TABS: ORAL_TABLET | ORAL | 0 refills | Status: DC

## 2017-05-11 MED ORDER — ALPRAZOLAM 0.5 MG PO TABS: ORAL_TABLET | ORAL | 0 refills | Status: DC

## 2017-05-11 MED ORDER — TRAMADOL HCL 50 MG PO TABS: ORAL_TABLET | ORAL | 0 refills | Status: DC

## 2017-05-11 NOTE — Progress Notes (Signed)
Subjective:    Patient ID: [name], female    DOB: Jun 07, 1955, 62 y.o.   MRN: [id number]  HPI  This nice 62 yo MWF currently undergoing Chemo at [facility] for a recurrent breast cancer (from 2004) was treated recently with a Z-Pak ( finished 2 days ago), but did not start the Prednisone she was prescribed. She initially improved, but over the last 2 days has relapsed with increasing congestion and refractory cough and also has had some chills.   Medication Sig  . ADDERALL 20 MG Take 1 tablet (20 mg total) by mouth 2 (two) times daily.  Marland Kitchen buPROPion-XL 300 MG Take 1 tablet (300 mg total) by mouth every morning.  Marland Kitchen lisinopril-hctz 20-25 MG TAKE 1 TABLET BY MOUTH EVERY DAY  . MILK THISTLE  Take 1 tablet by mouth daily.  . Multi-Vit w/ Mim Take 1 tablet by mouth daily.  . predniSONE  20 MG  NEVER STARTED   . Probiotic  Take 1 tablet by mouth daily.  . promethazine--DM  syrup Take 1 to 2 tsp enery 4 hours if needed for cough  . traZODone 50 MG 1-3 tablets for sleep  . ALPRAZolam  0.5 MG Take 0.5 mg by mouth. Take 1/2 to 1 tablet twice a day as needed for anxiety.  Marland Kitchen alendronate  70 MG Take 1 tablet (70 mg total) by mouth once a week.  Marland Kitchen azithromycin  250 MG  COMPLETED 2 days ago  . Ospemifene  60 MG  1 pill daily or every other day as directed   No Known Allergies   Past Medical History:  Diagnosis Date  . ADD (attention deficit disorder)   . Breast cancer ([place]) 2004   rt breast  . Cancer ([place]) 2004   Breast   . Hepatitis C 09/2012   Genotype 1a, sent to [facility]  . Hyperlipidemia   . Hypertension   . Personal history of radiation therapy 2004   rt breast   Past Surgical History:  Procedure Laterality Date  . AUGMENTATION MAMMAPLASTY Bilateral 2007  . BREAST LUMPECTOMY Right 2004  . LEEP  1994  . OVARIAN CYST REMOVAL Left 2004  . TUBAL LIGATION  2004   Review of Systems  10 point systems review negative except as above.    Objective:   Physical Exam   BP (!) 152/94    Pulse (!) 104   Temp (!) 97.5 F (36.4 C)   Resp 16   Ht 5' 1.5" (1.562 m)   Wt 121 lb 12.8 oz (55.2 kg)   BMI 22.64 kg/m   In No Distress. No Stridor. Brassy congested cough.   HEENT - Eac's patent. TM's Nl. EOM's full. PERRLA. NasoOroPharynx clear. Neck - supple. ? Sl assym and palp R thyroid lobe. Carotids 2+ & No bruits, nodes, JVD Chest - Scattered coarse  Rales & rhonchi and few forced post-tussive exp. wheezes. Cor - Nl HS. RRR w/o sig m. MS- FROM w/o deformities. Muscle power, tone and bulk Nl. Gait Nl. Neuro - No obvious Cr N abnormalities.  Nl w/o focal abnormalities. Psyche - Tearful. Skin - exposed clear w/o rash, cyanosis or icterus.    Assessment & Plan:   1. Tracheitis  - levofloxacin (LEVAQUIN) 500 MG tablet; Take 1 tablet daily with food for infection  Dispense: 15 tablet  - traMADol (ULTRAM) 50 MG tablet; Tqake 1/2 to 1 tablet every 4 h for severe cough  Dispense: 20 tablet  - benzonatate (TESSALON) 200 MG  capsule; Take 1 perle 3 x / day to prevent cough  Dispense: 30 capsule   2. Moderate persistent asthmatic bronchitis with acute exacerbation  - levofloxacin (LEVAQUIN) 500 MG tablet; Take 1 tablet daily with food for infection  Dispense: 15 tablet  - benzonatate (TESSALON) 200 MG capsule; Take 1 perle 3 x / day to prevent cough  Dispense: 30 capsule   3. Anxiety tension state  - ALPRAZolam (XANAX) 0.5 MG tablet; Take 1/2 to 1 tablet twice a day as needed for anxiety.  Dispense: 60 tablet; Refill: 0  4. ? Sl thyromegaly Rt >>Lt   - has f/u OV in 1 month to reassess

## 2017-05-11 NOTE — Telephone Encounter (Signed)
Pt was transferred to front office to schedule appt.

## 2017-05-11 NOTE — Telephone Encounter (Signed)
Patient states she has cough, has not been seen since Dec, requesting tussinex, will not send in due to controlled substance so make OV to come in

## 2017-05-11 NOTE — Patient Instructions (Signed)
Bronchospasm, Adult Bronchospasm is a tightening of the airways going into the lungs. During an episode, it may be harder to breathe. You may cough, and you may make a whistling sound when you breathe (wheeze). This condition often affects people with asthma. What are the causes? This condition is caused by swelling and irritation in the airways. It can be triggered by:  An infection (common).  Seasonal allergies.  An allergic reaction.  Exercise.  Irritants. These include pollution, cigarette smoke, strong odors, aerosol sprays, and paint fumes.  Weather changes. Winds increase molds and pollens in the air. Cold air may cause swelling.  Stress and emotional upset.  What are the signs or symptoms? Symptoms of this condition include:  Wheezing. If the episode was triggered by an allergy, wheezing may start right away or hours later.  Nighttime coughing.  Frequent or severe coughing with a simple cold.  Chest tightness.  Shortness of breath.  Decreased ability to exercise.  How is this diagnosed? This condition is usually diagnosed with a review of your medical history and a physical exam. Tests, such as lung function tests, are sometimes done to look for other conditions. The need for a chest X-ray depends on where the wheezing occurs and whether it is the first time you have wheezed. How is this treated? This condition may be treated with:  Inhaled medicines. These open up the airways and help you breathe. They can be taken with an inhaler or a nebulizer device.  Corticosteroid medicines. These may be given for severe bronchospasm, usually when it is associated with asthma.  Avoiding triggers, such as irritants, infection, or allergies.  Follow these instructions at home: Medicines  Take over-the-counter and prescription medicines only as told by your health care provider.  If you need to use an inhaler or nebulizer to take your medicine, ask your health care  provider to explain how to use it correctly. If you were given a spacer, always use it with your inhaler. Lifestyle  Reduce the number of triggers in your home. To do this: ? Change your heating and air conditioning filter at least once a month. ? Limit your use of fireplaces and wood stoves. ? Do not smoke. Do not allow smoking in your home. ? Avoid using perfumes and fragrances. ? Get rid of pests, such as roaches and mice, and their droppings. ? Remove any mold from your home. ? Keep your house clean and dust free. Use unscented cleaning products. ? Replace carpet with wood, tile, or vinyl flooring. Carpet can trap dander and dust. ? Use allergy-proof pillows, mattress covers, and box spring covers. ? Wash bed sheets and blankets every week in hot water. Dry them in a dryer. ? Use blankets that are made of polyester or cotton. ? Wash your hands often. ? Do not allow pets in your bedroom.  Avoid breathing in cold air when you exercise. General instructions  Have a plan for seeking medical care. Know when to call your health care provider and local emergency services, and where to get emergency care.  Stay up to date on your immunizations.  When you have an episode of bronchospasm, stay calm. Try to relax and breathe more slowly.  If you have asthma, make sure you have an asthma action plan.  Keep all follow-up visits as told by your health care provider. This is important. Contact a health care provider if:  You have muscle aches.  You have chest pain.  The mucus that you  cough up (sputum) changes from clear or white to yellow, green, gray, or bloody.  You have a fever.  Your sputum gets thicker. Get help right away if:  Your wheezing and coughing get worse, even after you take your prescribed medicines.  It gets even harder to breathe.  You develop severe chest pain. Summary  Bronchospasm is a tightening of the airways going into the lungs.  During an episode of  bronchospasm, you may have a harder time breathing. You may cough and make a whistling sound when you breathe (wheeze).  Avoid exposure to triggers such as smoke, dust, mold, animal dander, and fragrances.  When you have an episode of bronchospasm, stay calm. Try to relax and breathe more slowly. This information is not intended to replace advice given to you by your health care provider. Make sure you discuss any questions you have with your health care provider. Document Released: 01/23/2003 Document Revised: 01/17/2016 Document Reviewed: 01/17/2016 Elsevier Interactive Patient Education  2017 [employer].

## 2017-05-26 ENCOUNTER — Other Ambulatory Visit: Payer: Self-pay | Admitting: Physician Assistant

## 2017-05-26 DIAGNOSIS — F988 Other specified behavioral and emotional disorders with onset usually occurring in childhood and adolescence: Secondary | ICD-10-CM

## 2017-05-26 MED ORDER — AMPHETAMINE-DEXTROAMPHETAMINE 20 MG PO TABS: 20.0000 mg | ORAL_TABLET | Freq: Two times a day (BID) | ORAL | 0 refills | Status: DC

## 2017-05-29 ENCOUNTER — Other Ambulatory Visit: Payer: Self-pay | Admitting: Physician Assistant

## 2017-06-04 ENCOUNTER — Ambulatory Visit (INDEPENDENT_AMBULATORY_CARE_PROVIDER_SITE_OTHER): Payer: Managed Care, Other (non HMO) | Admitting: Physician Assistant

## 2017-06-04 ENCOUNTER — Encounter: Payer: Self-pay | Admitting: Physician Assistant

## 2017-06-04 VITALS — BP 108/60 | HR 101 | Temp 97.5°F | Resp 16 | Ht 61.5 in | Wt 119.0 lb

## 2017-06-04 DIAGNOSIS — E785 Hyperlipidemia, unspecified: Secondary | ICD-10-CM

## 2017-06-04 DIAGNOSIS — I10 Essential (primary) hypertension: Secondary | ICD-10-CM

## 2017-06-04 DIAGNOSIS — B1920 Unspecified viral hepatitis C without hepatic coma: Secondary | ICD-10-CM

## 2017-06-04 MED ORDER — PILOCARPINE HCL 2 % OP SOLN: OPHTHALMIC | 12 refills | Status: DC

## 2017-06-04 NOTE — Progress Notes (Signed)
Assessment and Plan:  Cholesterol -Continue diet and exercise. Check cholesterol.   Vitamin D Def - check level and continue medications.    Hepatitis C Has received treatment  Dry mouth Can prescribe pilocarpine drops to put in water Increase water Monitor  Breast cancer Continue follow up  Continue diet and meds as discussed. Further disposition pending results of labs. Over 30 minutes of exam, counseling, chart review, and critical decision making was performed  Future Appointments  Date Time Provider [place]  10/07/2017  9:00 AM Vicie Mutters, PA-C [id number] None     HPI 62 y.o. female  presents for 3 month follow up on hypertension, cholesterol, prediabetes, and vitamin D deficiency.   Being treated at [facility] for recurrent breast cancer, seen recently for trachitis and had palpable abnormal thyroid, here for reevaluation today. She completed chemo at [facility], suppose to go back to work May 27th, going to do radiation morning and lunch.   Her blood pressure has been controlled at home, she is on lisinopril, today their BP is BP: 108/60   She does workout. She denies chest pain, shortness of breath, dizziness.  She has depression and ADD, on wellbutrin and adderall will normally take one occ two a day. Prescription normally last 1.5 months.  Will take trazodone for sleep and she will use xanax during the day with recent chemo/diagnosis.    She is not on cholesterol medication and denies myalgias. Her cholesterol is at goal. The cholesterol last visit was:   Lab Results  Component Value Date   CHOL 281 (H) 10/07/2016   HDL 119 10/07/2016   LDLCALC 144 (H) 10/07/2016   TRIG 81 10/07/2016   CHOLHDL 2.4 10/07/2016   Patient is on Vitamin D supplement.   Lab Results  Component Value Date   VD25OH 50 10/07/2016     BMI is Body mass index is 22.12 kg/m., she is working on diet and exercise. Wt Readings from Last 3 Encounters:  06/04/17 119 lb (54 kg)   05/11/17 121 lb 12.8 oz (55.2 kg)  10/07/16 117 lb 12.8 oz (53.4 kg)    Current Medications:  Current Outpatient Medications on File Prior to Visit  Medication Sig  . ALPRAZolam (XANAX) 0.5 MG tablet Take 1/2 to 1 tablet twice a day as needed for anxiety.  Marland Kitchen amphetamine-dextroamphetamine (ADDERALL) 20 MG tablet Take 1 tablet (20 mg total) by mouth 2 (two) times daily.  Marland Kitchen buPROPion (WELLBUTRIN XL) 300 MG 24 hr tablet Take 1 tablet (300 mg total) by mouth every morning.  Marland Kitchen lisinopril-hydrochlorothiazide (PRINZIDE,ZESTORETIC) 20-25 MG tablet TAKE 1 TABLET BY MOUTH EVERY DAY  . MILK THISTLE PO Take 1 tablet by mouth daily.  . Multiple Vitamin (MULTIVITAMIN WITH MINERALS) TABS tablet Take 1 tablet by mouth daily.  . Probiotic Product (PROBIOTIC DAILY PO) Take 1 tablet by mouth daily.  . traZODone (DESYREL) 50 MG tablet 1-3 tablets for sleep   No current facility-administered medications on file prior to visit.     Medical History:  Past Medical History:  Diagnosis Date  . ADD (attention deficit disorder)   . Breast cancer ([place]) 2004   rt breast  . Cancer ([place]) 2004   Breast   . Hepatitis C 09/2012   Genotype 1a, sent to [facility]  . Hyperlipidemia   . Hypertension   . Personal history of radiation therapy 2004   rt breast   Allergies: No Known Allergies   Review of Systems:  Review of Systems  Constitutional:  Negative.   HENT: Negative.   Eyes: Negative.   Respiratory: Negative.   Cardiovascular: Negative.   Gastrointestinal: Negative.   Genitourinary: Negative.   Musculoskeletal: Negative.   Skin: Negative.     Family history- Review and unchanged Social history- Review and unchanged Physical Exam: BP 108/60   Pulse (!) 101   Temp (!) 97.5 F (36.4 C)   Resp 16   Ht 5' 1.5" (1.562 m)   Wt 119 lb (54 kg)   SpO2 99%   BMI 22.12 kg/m  Wt Readings from Last 3 Encounters:  06/04/17 119 lb (54 kg)  05/11/17 121 lb 12.8 oz (55.2 kg)  10/07/16 117 lb 12.8 oz  (53.4 kg)   General Appearance: Well nourished, in no apparent distress. Eyes: PERRLA, EOMs, conjunctiva no swelling or erythema Sinuses: No Frontal/maxillary tenderness ENT/Mouth: Ext aud canals clear, TMs without erythema, bulging. No erythema, swelling, or exudate on post pharynx.  Tonsils not swollen or erythematous. Hearing normal.  Neck: Supple, thyroid normal.  Respiratory: Respiratory effort normal, BS equal bilaterally without rales, rhonchi, wheezing or stridor.  Cardio: RRR with no MRGs. Brisk peripheral pulses without edema.  Abdomen: Soft, + BS,  Non tender, no guarding, rebound, hernias, masses. Lymphatics: Non tender without lymphadenopathy.  Musculoskeletal: Full ROM, 5/5 strength, Normal gait Skin: Warm, dry without rashes, lesions, ecchymosis.  Neuro: Cranial nerves intact. Normal muscle tone, no cerebellar symptoms. Psych: Awake and oriented X 3, normal affect, Insight and Judgment appropriate.    Vicie Mutters, PA-C 4:11 PM [facility]

## 2017-06-06 ENCOUNTER — Other Ambulatory Visit: Payer: Self-pay | Admitting: Physician Assistant

## 2017-07-01 NOTE — Progress Notes (Signed)
Location of Breast Cancer: Right Breast  Histology per Pathology Report:  T2, N1, M0, ER+, PR-, HER2- breast cancer, diagnosed in 12/2016.  Did patient present with symptoms or was this found on screening mammography?: She noted changes to her right breast herself.   Past/Anticipated interventions by surgeon, if any: 03/03/17 ([facility]) MASTECTOMY, SIMPLE, COMPLETE Right  INJECTION PROCEDURE; RADIOACTIVE TRACER FOR IDENTIFICATION OF SENTINEL NODE AXILLA Right  BIOPSY OR EXCISION OF LYMPH NODE(S) AXILLARY NODE(S) Right  REMOVAL OF MAMMARY IMPLANT MATERIAL Bilateral  PERIPROSTHETIC CAPSULECTOMY, BREAST Bilateral   Surgeon Surgeon Role Service Panel  Kathryn Copas, MD        Past/Anticipated interventions by medical oncology, if any:  She has been treated at [facility] Dr. Adan Barnett Documentation from 06/10/17: She is s/p C3 of Cyclophosphamide/Docetaxel on 05/20/17. She is here for C4 today. (cycle 4 was her last cycle)   Lymphedema issues, if any:  She denies. She has good arm mobility.   Pain issues, if any:  She denies  SAFETY ISSUES:  Prior radiation? Yes, 11/21/02- 01/06/03, Right Breast 5,040 cGy in 28 fractions. The tumor bed within the breast area was boosted further to a dose of 6,240 cGy by Dr. Sondra Barnett.   Pacemaker/ICD? No  Possible current pregnancy? No  Is the patient on methotrexate? No  Current Complaints / other details:   06/10/17 06/10/17 She was seen by Dr. Lamar Barnett from radiation oncology who recommended adjuvant chest wall radiation. As she wished to be closer to home during treatment, she requested a referral to Dr. Lowella Barnett in [place]. -  Dr. Annabell Barnett will see her back at [facility] after completion of her radiotherapy to discuss adjuvant endocrineherapy.  02/04/17 initial consult note from Dr. Adan Barnett [facility])  -She has a prior history of right breast DCIS in 2004 that was treated at [facility]. She underwent right lumpectomy on 10/11/2002 which showed  minute foci of residual DCIS, intermediate grade. Hormone receptor testing was not done. Closest approximated margin was 104m. Estimated size of involved area radiographically was 1 cm. [facility].  -She subsequently had breast reconstruction with bilateral saline implants in 2008 and later fat grafting (2015/2016 per patient). She also had a periareolar mastoplexy on the right side (May 2017).  -She had been doing well with normal [facility]ograms ([facility]. -A few months ago in the summer of 2018, she noticed changes to the fat grafting on the right side. More recently in November she noticed 2 palpable nodules in the medial periareolar aspect and superior aspect of right breast. She saw her plastic surgeon to further evaluate this.  -She underwent biopsy of these areas on 01/02/17 at the right medial breast and periareolar region which showed infiltrating tumor, favoring breast primary, in both areas composed of cords and strands of single cells (strongly staining for ER, PR, and pancytokeratin Ae1/3, GATA-2, synaptophysin. HER2 testing was not done. [facility], involving the dermal stroma of the skin. LVI was not identified.   BP 118/85 (BP Location: Left Arm, Patient Position: Sitting, Cuff Size: Normal)   Pulse 98   Temp 98.6 F (37 C) (Oral)   Resp 18   Ht 5' 2"  (1.575 m)   Wt 116 lb (52.6 kg)   SpO2 100%   BMI 21.22 kg/m    Wt Readings from Last 3 Encounters:  07/07/17 116 lb (52.6  kg)  06/04/17 119 lb (54 kg)  05/11/17 121 lb 12.8 oz (55.2 kg)           [name], Kathryn Police, RN 07/01/2017,4:34 PM

## 2017-07-02 ENCOUNTER — Ambulatory Visit: Payer: Self-pay | Admitting: Adult Health

## 2017-07-03 ENCOUNTER — Encounter: Payer: Self-pay | Admitting: Radiation Oncology

## 2017-07-06 ENCOUNTER — Ambulatory Visit
Admission: RE | Admit: 2017-07-06 | Discharge: 2017-07-06 | Disposition: A | Discharge status: Home / Self Care | Payer: Self-pay | Source: Ambulatory Visit | Attending: Radiation Oncology | Admitting: Radiation Oncology

## 2017-07-06 ENCOUNTER — Inpatient Hospital Stay
Admission: RE | Admit: 2017-07-06 | Discharge: 2017-07-06 | Disposition: A | Discharge status: Home / Self Care | Payer: Self-pay | Source: Ambulatory Visit | Attending: Radiation Oncology | Admitting: Radiation Oncology

## 2017-07-06 ENCOUNTER — Ambulatory Visit: Payer: Managed Care, Other (non HMO)

## 2017-07-06 ENCOUNTER — Ambulatory Visit: Payer: Managed Care, Other (non HMO) | Admitting: Radiation Oncology

## 2017-07-06 ENCOUNTER — Other Ambulatory Visit: Payer: Self-pay | Admitting: Radiation Oncology

## 2017-07-06 DIAGNOSIS — C50911 Malignant neoplasm of unspecified site of right female breast: Secondary | ICD-10-CM

## 2017-07-07 ENCOUNTER — Other Ambulatory Visit: Payer: Self-pay

## 2017-07-07 ENCOUNTER — Ambulatory Visit
Admission: RE | Admit: 2017-07-07 | Discharge: 2017-07-07 | Disposition: A | Discharge status: Home / Self Care | Payer: Managed Care, Other (non HMO) | Source: Ambulatory Visit | Attending: Radiation Oncology | Admitting: Radiation Oncology

## 2017-07-07 ENCOUNTER — Encounter: Payer: Self-pay | Admitting: Radiation Oncology

## 2017-07-07 DIAGNOSIS — F988 Other specified behavioral and emotional disorders with onset usually occurring in childhood and adolescence: Secondary | ICD-10-CM | POA: Diagnosis not present

## 2017-07-07 DIAGNOSIS — Z9221 Personal history of antineoplastic chemotherapy: Secondary | ICD-10-CM | POA: Insufficient documentation

## 2017-07-07 DIAGNOSIS — Z79899 Other long term (current) drug therapy: Secondary | ICD-10-CM | POA: Insufficient documentation

## 2017-07-07 DIAGNOSIS — Z923 Personal history of irradiation: Secondary | ICD-10-CM | POA: Insufficient documentation

## 2017-07-07 DIAGNOSIS — Z803 Family history of malignant neoplasm of breast: Secondary | ICD-10-CM | POA: Diagnosis not present

## 2017-07-07 DIAGNOSIS — E785 Hyperlipidemia, unspecified: Secondary | ICD-10-CM | POA: Insufficient documentation

## 2017-07-07 DIAGNOSIS — M899 Disorder of bone, unspecified: Secondary | ICD-10-CM | POA: Diagnosis not present

## 2017-07-07 DIAGNOSIS — I10 Essential (primary) hypertension: Secondary | ICD-10-CM | POA: Diagnosis not present

## 2017-07-07 DIAGNOSIS — Z809 Family history of malignant neoplasm, unspecified: Secondary | ICD-10-CM | POA: Insufficient documentation

## 2017-07-07 DIAGNOSIS — C50911 Malignant neoplasm of unspecified site of right female breast: Secondary | ICD-10-CM

## 2017-07-07 DIAGNOSIS — Z87891 Personal history of nicotine dependence: Secondary | ICD-10-CM | POA: Diagnosis not present

## 2017-07-07 DIAGNOSIS — Z170 Estrogen receptor positive status [ER+]: Secondary | ICD-10-CM | POA: Insufficient documentation

## 2017-07-07 DIAGNOSIS — C50211 Malignant neoplasm of upper-inner quadrant of right female breast: Secondary | ICD-10-CM | POA: Diagnosis present

## 2017-07-07 HISTORY — DX: Personal history of irradiation: Z92.3

## 2017-07-07 NOTE — Progress Notes (Signed)
Radiation Oncology         [phone] ________________________________  Outpatient Consultation  Name: [name] MRN: [id number]  Date: 07/07/2017  DOB: [birth year]  CC:[name], MD  Izora Ribas, MD   REFERRING PHYSICIAN: Izora Ribas, MD  DIAGNOSIS:    ICD-10-CM   1. Carcinoma of upper-inner quadrant of right breast in female, estrogen receptor positive ([place]) C50.211    Z17.0    Cancer Staging Carcinoma of upper-inner quadrant of right breast in female, estrogen receptor positive ([place]) Staging form: Breast, AJCC 8th Edition - Pathologic: Stage IIA (pT2, pN80m, cM0, G3, ER+, PR+, HER2-) - Signed by SEppie Gibson MD on 07/08/2017 ([place])  CHIEF COMPLAINT: Here to discuss management of right breast cancer  HISTORY OF PRESENT ILLNESS::[name] is a 62y.o. female who presented with her husband.    She has a history of pre-invasive breast cancer, and was followed by Dr. KSondra Comein 2004. At that time she did receive post lumpectomy adjuvant radiation therapy 11/21/2002 - 01/06/2003, 5,040cGy in 28 fractions, with a boost of 6,240 cGy. She did not have hormonal medication or chemotherapy at that time. She has a family history of breast cancer in her sister and her aunt. She also reports an extensive family history of various other cancers.   On 01/02/17 patient saw Dr. BHarlow Mares her plastic surgeon, due to two reddish nodules on her  right breast. They were biopsied and path revealed infiltrating carcinoma favoring breast origin. These nodules were ER and PR positive.   She presented in late December and saw Dr.Georgiade of [facility] for surgical options.  [facility], CT of chest/abdomin/pelvis, demonstrated no metastasis disease. There was a segment to liver lesion favored to be hemangioma an a non-specific sclerotic lesion at L5.   Bone scan on 03/19/17 showed non-specific activity at the left femoral head/neck junction.   [facility] form [facility] total [facility]aste[facility]. This revealed grade III multi-focal invasive carcinoma with ductal and lobular features. The foci measured 29 mm and 4 mm. Satellite skin foci of invasive carcinoma were noted. Margins were negative by 1 mm posteriorly. 1 sentinel lymph node was removed and this showed micro-metastatic disease. Her pathologic stage was pT2 N144m (Should be noted that patient was documented to have pT4b based on skin punch biopsies.) Her disease was ER 70%, PR 30%. HER2 status is difficult to find in her path report. However, according to medical oncology's notes, she was deemed to have HER2 negative disease. 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    [facility] SuLamar Blinksecommended adjuvant chest-wall re-irradiation due to "her single sentinel lymph node positivity in the absence of an axillary lymph node dissection, high Oncotype Dx score, and concern for skin involvement on initial exam as well as dermal involvement on final pathology". Patient requested to see me to get her tx close to home.   [facility]. She thinks she saw Dr. MaJana Hakimn the past and would like to transfer to him. She would also like to transfer to a suPsychologist, sport and exercisen [place] well.  It has been about 3 weeks since she finished chemotherapy. She stopped taking gabapentin, but noticed some tingling and restarted the medication. The patient has noticed generalized soreness to her upper legs. She denies HA,  SOB, issues with bowel or bladder movements, pain in her back, depression or any other symptoms or complaints at this time.   PREVIOUS RADIATION THERAPY: Yes, 11/21/2002 - 01/06/2003, 5,040cGy in 28 fractions. The tumor bed within the breast area was boosted further to a dose of 6,240 cGy by Dr. Sondra Come.  PAST MEDICAL HISTORY:  has  a past medical history of ADD (attention deficit disorder), Breast cancer ([place]) (2004), Cancer ([place]) (2004), Hepatitis C (09/2012), History of radiation therapy (11/21/02- 01/06/03), Hyperlipidemia, Hypertension, and Personal history of radiation therapy (2004).    PAST SURGICAL HISTORY: Past Surgical History:  Procedure Laterality Date  . AUGMENTATION MAMMAPLASTY Bilateral 2007  . BREAST LUMPECTOMY Right 2004  . LEEP  1994  . OVARIAN CYST REMOVAL Left 2004  . TUBAL LIGATION  2004    FAMILY HISTORY: family history includes Cancer in her mother and sister; Hypertension in her father; Stroke in her father.  SOCIAL HISTORY:  reports that she quit smoking about 29 years ago. Her smoking use included cigarettes. She has never used smokeless tobacco. She reports that she drinks alcohol. She reports that she does not use drugs.  ALLERGIES: Patient has no known allergies.  MEDICATIONS:  Current Outpatient Medications  Medication Sig Dispense Refill  . ALPRAZolam (XANAX) 0.5 MG tablet Take 1/2 to 1 tablet twice a day as needed for anxiety. 60 tablet 0  . amphetamine-dextroamphetamine (ADDERALL) 20 MG tablet Take 1 tablet (20 mg total) by mouth 2 (two) times daily. (Patient taking differently: Take 20 mg by mouth daily. ) 60 tablet 0  . buPROPion (WELLBUTRIN XL) 300 MG 24 hr tablet TAKE 1 TABLET BY MOUTH EVERY DAY IN THE MORNING 90 tablet 1  . Cholecalciferol (VITAMIN D-1000 MAX ST) 1000 units tablet Take by mouth.    . gabapentin (NEURONTIN) 100 MG capsule Take 100 mg by mouth 2 (two) times daily.     Marland Kitchen lisinopril-hydrochlorothiazide (PRINZIDE,ZESTORETIC) 20-25 MG tablet TAKE 1 TABLET BY MOUTH EVERY DAY 90 tablet 1  . MILK THISTLE PO Take 1 tablet by mouth daily.    . Multiple Vitamin (MULTIVITAMIN WITH MINERALS) TABS tablet Take 1 tablet by mouth daily.    . pilocarpine (PILOCAR) 2 % ophthalmic solution Use 4-5 drops in 4 oz of water up to 4 x a day 15 mL 12  . Probiotic Product (PROBIOTIC  DAILY PO) Take 1 tablet by mouth daily.    . traZODone (DESYREL) 50 MG tablet 1-3 tablets for sleep 270 tablet 0   No current facility-administered medications for this encounter.     REVIEW OF SYSTEMS: A 10+ POINT REVIEW OF SYSTEMS WAS OBTAINED including neurology, dermatology, psychiatry, cardiac, respiratory, lymph, extremities, GI, GU, Musculoskeletal, constitutional, breasts, reproductive, HEENT.  All pertinent positives are noted in the HPI.  All others are negative.   PHYSICAL EXAM:  height is 5' 2" (1.575 m) and weight is 116 lb (52.6 kg). Her oral temperature is 98.6 F (37 C). Her blood pressure is 118/85 and her pulse is 98. Her respiration is 18 and oxygen saturation is 100%.   General: Alert and oriented, in no acute distress HEENT: Head is normocephalic. Extraocular movements are intact. Oropharynx is clear. Neck: Neck is supple, no palpable cervical or supraclavicular lymphadenopathy. Heart: Regular in rate and rhythm with no murmurs, rubs, or gallops. Chest: Clear to auscultation bilaterally, with no rhonchi, wheezes, or rales. Abdomen: Soft, nontender, nondistended, with no rigidity or guarding. Extremities: No cyanosis or edema. Good ROM in right shoulder  Lymphatics: see Neck Exam Skin: No concerning lesions. Musculoskeletal: symmetric strength and muscle tone throughout. Neurologic: Cranial nerves II through XII are grossly intact. No obvious focalities. Speech is fluent. Coordination is intact. Psychiatric: Judgment and insight are intact. Affect is appropriate. Breasts: No paplable or visual recurrence over the right chest wall surface, surgical scars have healed well. Left breast no palpable masses. No palpable masses appreciated in the axillae.  ECOG = 0  0 - Asymptomatic (Fully active, able to carry on all predisease activities without restriction)  1 - Symptomatic but completely ambulatory (Restricted in physically strenuous activity but ambulatory and able to  carry out work of a light or sedentary nature. For example, light housework, office work)  2 - Symptomatic, <50% in bed during the day (Ambulatory and capable of all self care but unable to carry out any work activities. Up and about more than 50% of waking hours)  3 - Symptomatic, >50% in bed, but not bedbound (Capable of only limited self-care, confined to bed or chair 50% or more of waking hours)  4 - Bedbound (Completely disabled. Cannot carry on any self-care. Totally confined to bed or chair)  5 - Death   Eustace Pen MM, Creech RH, Tormey DC, et al. [phone]). "Toxicity and response criteria of the [facility] Group". [place] Oncol. 5 (6): 649-55   LABORATORY DATA:  Lab Results  Component Value Date   WBC 7.5 10/07/2016   HGB 13.8 10/07/2016   HCT 40.4 10/07/2016   MCV 88.8 10/07/2016   PLT 285 10/07/2016   CMP     Component Value Date/Time   NA 138 10/07/2016 1544   NA 143 10/02/2014 1207   K 4.2 10/07/2016 1544   CL 98 10/07/2016 1544   CO2 29 10/07/2016 1544   GLUCOSE 86 10/07/2016 1544   BUN 22 10/07/2016 1544   BUN 8 10/02/2014 1207   CREATININE 0.85 10/07/2016 1544   CALCIUM 9.8 10/07/2016 1544   PROT 7.5 10/07/2016 1544   PROT 7.0 10/02/2014 1207   ALBUMIN 4.6 04/09/2016 1208   ALBUMIN 4.5 10/02/2014 1207   AST 22 10/07/2016 1544   ALT 16 10/07/2016 1544   ALKPHOS 98 04/09/2016 1208   BILITOT 0.6 10/07/2016 1544   BILITOT 0.4 10/02/2014 1207   GFRNONAA 74 10/07/2016 1544   GFRAA 86 10/07/2016 1544       RADIOGRAPHY: as above    IMPRESSION/PLAN:  Recurrence breast cancer, right breast, s/p mastectomy and chemotherapy.  It was a pleasure meeting the patient today. We discussed the risks, benefits, and side effects of radiotherapy. I recommend radiotherapy to the chest wall to reduce her risk of locoregional recurrence by 2/3. I will also treat the regional nodes. I concur with the rationale of Dr Lamar Blinks to treat with radiotherapy, given the  nodal involvement (albeit microscopic) without further nodal dissection, dermal/skin involvement, high grade disease.  Although this would be re-irradiation, she has fortunately had about 15 years for tissue repair since the prior treatment.  We discussed that radiation would take approximately 6-7 weeks to complete.  We discussed the increased risks of tissue injury/neuropathy/wound issues due to prior radiotherapy. We spoke about acute effects including skin irritation and fatigue as well as much less common late effects including internal organ injury or irritation. We spoke about the latest technology that is used to minimize the risk of late effects for patients undergoing radiotherapy to the breast or chest wall. No guarantees of treatment were given. The  patient is enthusiastic about proceeding with treatment. I look forward to participating in the patient's care. Consent signed today.  Simulation will occur in the near future.  __________________________________________   Eppie Gibson, MD   This document serves as a record of services personally performed by Eppie Gibson, MD. It was created on his behalf by Wilburn Mylar, a trained medical scribe. The creation of this record is based on the scribe's personal observations and the provider's statements to them. This document has been checked and approved by the attending provider.

## 2017-07-08 ENCOUNTER — Encounter: Payer: Self-pay | Admitting: Radiation Oncology

## 2017-07-08 DIAGNOSIS — Z170 Estrogen receptor positive status [ER+]: Secondary | ICD-10-CM

## 2017-07-08 DIAGNOSIS — C50211 Malignant neoplasm of upper-inner quadrant of right female breast: Secondary | ICD-10-CM | POA: Insufficient documentation

## 2017-07-13 ENCOUNTER — Ambulatory Visit
Admission: RE | Admit: 2017-07-13 | Discharge: 2017-07-13 | Disposition: A | Discharge status: Home / Self Care | Payer: Managed Care, Other (non HMO) | Source: Ambulatory Visit | Attending: Radiation Oncology | Admitting: Radiation Oncology

## 2017-07-13 DIAGNOSIS — Z170 Estrogen receptor positive status [ER+]: Secondary | ICD-10-CM | POA: Insufficient documentation

## 2017-07-13 DIAGNOSIS — C50211 Malignant neoplasm of upper-inner quadrant of right female breast: Secondary | ICD-10-CM | POA: Diagnosis present

## 2017-07-13 DIAGNOSIS — Z510 Encounter for antineoplastic radiation therapy: Secondary | ICD-10-CM | POA: Insufficient documentation

## 2017-07-14 ENCOUNTER — Encounter: Payer: Self-pay | Admitting: Radiation Oncology

## 2017-07-14 NOTE — Progress Notes (Signed)
  Radiation Oncology         [phone]) [phone] ________________________________  Name: [name] MRN: [id number]  Date: 07/13/2017  DOB: 1955-04-03  SIMULATION AND TREATMENT PLANNING NOTE   Special Treatment Procedure Note  Outpatient  DIAGNOSIS:     ICD-10-CM   1. Carcinoma of upper-inner quadrant of right breast in female, estrogen receptor positive ([place]) C50.211    Z17.0     NARRATIVE:  The patient was brought to the [facility].  Identity was confirmed.  All relevant records and images related to the planned course of therapy were reviewed.  The patient freely provided informed written consent to proceed with treatment after reviewing the details related to the planned course of therapy. The consent form was witnessed and verified by the simulation staff.    Then, the patient was set-up in a stable reproducible supine position for radiation therapy with her ipsilateral arm over her head, and her upper body secured in a custom-made Vac-lok device.  CT images were obtained.  Surface markings were placed.  The CT images were loaded into the planning software.    TREATMENT PLANNING NOTE: Treatment planning then occurred.  The radiation prescription was entered and confirmed.     A total of 5 medically necessary complex treatment devices were fabricated and supervised by me: 4 fields with MLCs for custom blocks to protect heart, and lungs;  and, a Vac-lok. MORE COMPLEX DEVICES MAY BE MADE IN DOSIMETRY FOR FIELD IN FIELD BEAMS FOR DOSE HOMOGENEITY.  I have requested : 3D Simulation which is medically necessary to give adequate dose to at risk tissues while sparing lungs and heart.  I have requested a DVH of the following structures: lungs, heart, esophagus, cord.    The patient will receive 50.4 Gy in 28 fractions to the right chest wall and SCV, Axillary nodes with 4 fields.  This will not be followed by a boost.  Optical Surface Tracking Plan:  Since intensity modulated  radiotherapy (IMRT) and 3D conformal radiation treatment methods are predicated on accurate and precise positioning for treatment, intrafraction motion monitoring is medically necessary to ensure accurate and safe treatment delivery. The ability to quantify intrafraction motion without excessive ionizing radiation dose can only be performed with optical surface tracking. Accordingly, surface imaging offers the opportunity to obtain 3D measurements of patient position throughout IMRT and 3D treatments without excessive radiation exposure. I am ordering optical surface tracking for this patient's upcoming course of radiotherapy.  ________________________________   Reference:  Ursula Alert, J, et al. Surface imaging-based analysis of intrafraction motion for breast radiotherapy patients.Journal of [place], n. 6, nov. 2014. ISSN [id number].  Available at: <http://www.jacmp.org/index.php/jacmp/article/view/4957>.   Special Treatment Procedure Note: The patient received prior radiotherapy in her current fields. There will be overlap of radiation dose.  Prior regional radiotherapy increases the risk of side effects from treatment. I have considered this in the treatment planning process.  This increases the complexity of this patient's treatment and therefore this constitutes a special treatment procedure.    -----------------------------------  Eppie Gibson, MD

## 2017-07-17 DIAGNOSIS — Z510 Encounter for antineoplastic radiation therapy: Secondary | ICD-10-CM | POA: Diagnosis not present

## 2017-07-20 ENCOUNTER — Ambulatory Visit
Admission: RE | Admit: 2017-07-20 | Discharge: 2017-07-20 | Disposition: A | Discharge status: Home / Self Care | Payer: Managed Care, Other (non HMO) | Source: Ambulatory Visit | Attending: Radiation Oncology | Admitting: Radiation Oncology

## 2017-07-20 ENCOUNTER — Other Ambulatory Visit: Payer: Self-pay | Admitting: Internal Medicine

## 2017-07-20 ENCOUNTER — Other Ambulatory Visit: Payer: Self-pay | Admitting: Physician Assistant

## 2017-07-20 DIAGNOSIS — F419 Anxiety disorder, unspecified: Secondary | ICD-10-CM

## 2017-07-20 DIAGNOSIS — G4700 Insomnia, unspecified: Secondary | ICD-10-CM

## 2017-07-20 DIAGNOSIS — Z510 Encounter for antineoplastic radiation therapy: Secondary | ICD-10-CM | POA: Diagnosis not present

## 2017-07-20 DIAGNOSIS — F988 Other specified behavioral and emotional disorders with onset usually occurring in childhood and adolescence: Secondary | ICD-10-CM

## 2017-07-20 MED ORDER — AMPHETAMINE-DEXTROAMPHETAMINE 20 MG PO TABS: ORAL_TABLET | ORAL | 0 refills | Status: DC

## 2017-07-20 MED ORDER — TRAZODONE HCL 50 MG PO TABS: ORAL_TABLET | ORAL | 0 refills | Status: DC

## 2017-07-20 MED ORDER — ALPRAZOLAM 0.5 MG PO TABS: ORAL_TABLET | ORAL | 0 refills | Status: DC

## 2017-07-21 ENCOUNTER — Ambulatory Visit
Admission: RE | Admit: 2017-07-21 | Discharge: 2017-07-21 | Disposition: A | Discharge status: Home / Self Care | Payer: Managed Care, Other (non HMO) | Source: Ambulatory Visit | Attending: Radiation Oncology | Admitting: Radiation Oncology

## 2017-07-21 DIAGNOSIS — Z170 Estrogen receptor positive status [ER+]: Principal | ICD-10-CM

## 2017-07-21 DIAGNOSIS — Z510 Encounter for antineoplastic radiation therapy: Secondary | ICD-10-CM | POA: Diagnosis not present

## 2017-07-21 DIAGNOSIS — C50211 Malignant neoplasm of upper-inner quadrant of right female breast: Secondary | ICD-10-CM

## 2017-07-21 MED ORDER — ALRA NON-METALLIC DEODORANT (RAD-ONC)
1.0000 "application " | Freq: Once | TOPICAL | Status: AC
  Administered 2017-07-21: 1 via TOPICAL

## 2017-07-21 MED ORDER — RADIAPLEXRX EX GEL
Freq: Once | CUTANEOUS | Status: AC
  Administered 2017-07-21: 12:00:00 via TOPICAL

## 2017-07-21 NOTE — Progress Notes (Signed)
Pt here for patient teaching.  Pt given Radiation and You booklet, Alra deodorant and Radiaplex gel.  Reviewed areas of pertinence such as fatigue, hair loss, skin changes, breast tenderness, breast swelling and taste changes . Pt able to give teach back of to pat skin and use unscented/gentle soap,apply Radiaplex bid and avoid applying anything to skin within 4 hours of treatment. Pt demonstrated understanding and verbalizes understanding of information given and will contact nursing with any questions or concerns.     Loma Sousa, RN BSN

## 2017-07-22 ENCOUNTER — Ambulatory Visit
Admission: RE | Admit: 2017-07-22 | Discharge: 2017-07-22 | Disposition: A | Discharge status: Home / Self Care | Payer: Managed Care, Other (non HMO) | Source: Ambulatory Visit | Attending: Radiation Oncology | Admitting: Radiation Oncology

## 2017-07-22 DIAGNOSIS — Z510 Encounter for antineoplastic radiation therapy: Secondary | ICD-10-CM | POA: Diagnosis not present

## 2017-07-23 ENCOUNTER — Ambulatory Visit
Admission: RE | Admit: 2017-07-23 | Discharge: 2017-07-23 | Disposition: A | Discharge status: Home / Self Care | Payer: Managed Care, Other (non HMO) | Source: Ambulatory Visit | Attending: Radiation Oncology | Admitting: Radiation Oncology

## 2017-07-23 DIAGNOSIS — Z510 Encounter for antineoplastic radiation therapy: Secondary | ICD-10-CM | POA: Diagnosis not present

## 2017-07-24 ENCOUNTER — Ambulatory Visit
Admission: RE | Admit: 2017-07-24 | Discharge: 2017-07-24 | Disposition: A | Discharge status: Home / Self Care | Payer: Managed Care, Other (non HMO) | Source: Ambulatory Visit | Attending: Radiation Oncology | Admitting: Radiation Oncology

## 2017-07-24 DIAGNOSIS — Z510 Encounter for antineoplastic radiation therapy: Secondary | ICD-10-CM | POA: Diagnosis not present

## 2017-07-27 ENCOUNTER — Other Ambulatory Visit: Payer: Self-pay | Admitting: Radiation Oncology

## 2017-07-27 ENCOUNTER — Telehealth: Payer: Self-pay | Admitting: *Deleted

## 2017-07-27 ENCOUNTER — Ambulatory Visit
Admission: RE | Admit: 2017-07-27 | Discharge: 2017-07-27 | Disposition: A | Discharge status: Home / Self Care | Payer: Managed Care, Other (non HMO) | Source: Ambulatory Visit | Attending: Radiation Oncology | Admitting: Radiation Oncology

## 2017-07-27 DIAGNOSIS — Z510 Encounter for antineoplastic radiation therapy: Secondary | ICD-10-CM | POA: Diagnosis not present

## 2017-07-27 DIAGNOSIS — Z170 Estrogen receptor positive status [ER+]: Principal | ICD-10-CM

## 2017-07-27 DIAGNOSIS — C50211 Malignant neoplasm of upper-inner quadrant of right female breast: Secondary | ICD-10-CM

## 2017-07-27 NOTE — Telephone Encounter (Signed)
CALLED PATIENT TO INFORM OF APPT. WITH DR. Donne Hazel ON 09-25-17 - ARRIVAL TIME - 10 AM, SPOKE WITH PATIENT AND SHE IS AWARE OF THIS APPT.

## 2017-07-28 ENCOUNTER — Ambulatory Visit
Admission: RE | Admit: 2017-07-28 | Discharge: 2017-07-28 | Disposition: A | Discharge status: Home / Self Care | Payer: Managed Care, Other (non HMO) | Source: Ambulatory Visit | Attending: Radiation Oncology | Admitting: Radiation Oncology

## 2017-07-28 DIAGNOSIS — Z510 Encounter for antineoplastic radiation therapy: Secondary | ICD-10-CM | POA: Diagnosis not present

## 2017-07-29 ENCOUNTER — Ambulatory Visit
Admission: RE | Admit: 2017-07-29 | Discharge: 2017-07-29 | Disposition: A | Discharge status: Home / Self Care | Payer: Managed Care, Other (non HMO) | Source: Ambulatory Visit | Attending: Radiation Oncology | Admitting: Radiation Oncology

## 2017-07-29 DIAGNOSIS — Z510 Encounter for antineoplastic radiation therapy: Secondary | ICD-10-CM | POA: Diagnosis not present

## 2017-07-30 ENCOUNTER — Ambulatory Visit
Admission: RE | Admit: 2017-07-30 | Discharge: 2017-07-30 | Disposition: A | Discharge status: Home / Self Care | Payer: Managed Care, Other (non HMO) | Source: Ambulatory Visit | Attending: Radiation Oncology | Admitting: Radiation Oncology

## 2017-07-30 DIAGNOSIS — Z510 Encounter for antineoplastic radiation therapy: Secondary | ICD-10-CM | POA: Diagnosis not present

## 2017-07-31 ENCOUNTER — Ambulatory Visit
Admission: RE | Admit: 2017-07-31 | Discharge: 2017-07-31 | Disposition: A | Discharge status: Home / Self Care | Payer: Managed Care, Other (non HMO) | Source: Ambulatory Visit | Attending: Radiation Oncology | Admitting: Radiation Oncology

## 2017-07-31 DIAGNOSIS — Z510 Encounter for antineoplastic radiation therapy: Secondary | ICD-10-CM | POA: Diagnosis not present

## 2017-08-03 ENCOUNTER — Ambulatory Visit
Admission: RE | Admit: 2017-08-03 | Discharge: 2017-08-03 | Disposition: A | Discharge status: Home / Self Care | Payer: Managed Care, Other (non HMO) | Source: Ambulatory Visit | Attending: Radiation Oncology | Admitting: Radiation Oncology

## 2017-08-03 DIAGNOSIS — Z170 Estrogen receptor positive status [ER+]: Secondary | ICD-10-CM | POA: Diagnosis not present

## 2017-08-03 DIAGNOSIS — Z510 Encounter for antineoplastic radiation therapy: Secondary | ICD-10-CM | POA: Diagnosis not present

## 2017-08-03 DIAGNOSIS — C50211 Malignant neoplasm of upper-inner quadrant of right female breast: Secondary | ICD-10-CM | POA: Diagnosis not present

## 2017-08-04 ENCOUNTER — Ambulatory Visit
Admission: RE | Admit: 2017-08-04 | Discharge: 2017-08-04 | Disposition: A | Discharge status: Home / Self Care | Payer: Managed Care, Other (non HMO) | Source: Ambulatory Visit | Attending: Radiation Oncology | Admitting: Radiation Oncology

## 2017-08-04 DIAGNOSIS — Z510 Encounter for antineoplastic radiation therapy: Secondary | ICD-10-CM | POA: Diagnosis not present

## 2017-08-05 ENCOUNTER — Ambulatory Visit
Admission: RE | Admit: 2017-08-05 | Discharge: 2017-08-05 | Disposition: A | Discharge status: Home / Self Care | Payer: Managed Care, Other (non HMO) | Source: Ambulatory Visit | Attending: Radiation Oncology | Admitting: Radiation Oncology

## 2017-08-05 DIAGNOSIS — Z510 Encounter for antineoplastic radiation therapy: Secondary | ICD-10-CM | POA: Diagnosis not present

## 2017-08-07 ENCOUNTER — Ambulatory Visit
Admission: RE | Admit: 2017-08-07 | Discharge: 2017-08-07 | Disposition: A | Discharge status: Home / Self Care | Payer: Managed Care, Other (non HMO) | Source: Ambulatory Visit | Attending: Radiation Oncology | Admitting: Radiation Oncology

## 2017-08-07 DIAGNOSIS — Z510 Encounter for antineoplastic radiation therapy: Secondary | ICD-10-CM | POA: Diagnosis not present

## 2017-08-10 ENCOUNTER — Ambulatory Visit
Admission: RE | Admit: 2017-08-10 | Discharge: 2017-08-10 | Disposition: A | Discharge status: Home / Self Care | Payer: Managed Care, Other (non HMO) | Source: Ambulatory Visit | Attending: Radiation Oncology | Admitting: Radiation Oncology

## 2017-08-10 DIAGNOSIS — Z510 Encounter for antineoplastic radiation therapy: Secondary | ICD-10-CM | POA: Diagnosis not present

## 2017-08-11 ENCOUNTER — Ambulatory Visit
Admission: RE | Admit: 2017-08-11 | Discharge: 2017-08-11 | Disposition: A | Discharge status: Home / Self Care | Payer: Managed Care, Other (non HMO) | Source: Ambulatory Visit | Attending: Radiation Oncology | Admitting: Radiation Oncology

## 2017-08-11 DIAGNOSIS — C50211 Malignant neoplasm of upper-inner quadrant of right female breast: Secondary | ICD-10-CM

## 2017-08-11 DIAGNOSIS — Z170 Estrogen receptor positive status [ER+]: Principal | ICD-10-CM

## 2017-08-11 DIAGNOSIS — Z510 Encounter for antineoplastic radiation therapy: Secondary | ICD-10-CM | POA: Diagnosis not present

## 2017-08-11 MED ORDER — RADIAPLEXRX EX GEL
Freq: Once | CUTANEOUS | Status: AC
  Administered 2017-08-11: 09:00:00 via TOPICAL

## 2017-08-11 MED ORDER — SONAFINE EX EMUL
1.0000 "application " | Freq: Two times a day (BID) | CUTANEOUS | Status: DC
  Administered 2017-08-11: 1 via TOPICAL

## 2017-08-12 ENCOUNTER — Ambulatory Visit
Admission: RE | Admit: 2017-08-12 | Discharge: 2017-08-12 | Disposition: A | Discharge status: Home / Self Care | Payer: Managed Care, Other (non HMO) | Source: Ambulatory Visit | Attending: Radiation Oncology | Admitting: Radiation Oncology

## 2017-08-12 DIAGNOSIS — Z510 Encounter for antineoplastic radiation therapy: Secondary | ICD-10-CM | POA: Diagnosis not present

## 2017-08-13 ENCOUNTER — Ambulatory Visit
Admission: RE | Admit: 2017-08-13 | Discharge: 2017-08-13 | Disposition: A | Discharge status: Home / Self Care | Payer: Managed Care, Other (non HMO) | Source: Ambulatory Visit | Attending: Radiation Oncology | Admitting: Radiation Oncology

## 2017-08-13 DIAGNOSIS — Z510 Encounter for antineoplastic radiation therapy: Secondary | ICD-10-CM | POA: Diagnosis not present

## 2017-08-14 ENCOUNTER — Ambulatory Visit
Admission: RE | Admit: 2017-08-14 | Discharge: 2017-08-14 | Disposition: A | Discharge status: Home / Self Care | Payer: Managed Care, Other (non HMO) | Source: Ambulatory Visit | Attending: Radiation Oncology | Admitting: Radiation Oncology

## 2017-08-14 DIAGNOSIS — Z510 Encounter for antineoplastic radiation therapy: Secondary | ICD-10-CM | POA: Diagnosis not present

## 2017-08-17 ENCOUNTER — Ambulatory Visit
Admission: RE | Admit: 2017-08-17 | Discharge: 2017-08-17 | Disposition: A | Discharge status: Home / Self Care | Payer: Managed Care, Other (non HMO) | Source: Ambulatory Visit | Attending: Radiation Oncology | Admitting: Radiation Oncology

## 2017-08-17 DIAGNOSIS — Z510 Encounter for antineoplastic radiation therapy: Secondary | ICD-10-CM | POA: Diagnosis not present

## 2017-08-18 ENCOUNTER — Ambulatory Visit
Admission: RE | Admit: 2017-08-18 | Discharge: 2017-08-18 | Disposition: A | Discharge status: Home / Self Care | Payer: Managed Care, Other (non HMO) | Source: Ambulatory Visit | Attending: Radiation Oncology | Admitting: Radiation Oncology

## 2017-08-18 DIAGNOSIS — Z510 Encounter for antineoplastic radiation therapy: Secondary | ICD-10-CM | POA: Diagnosis not present

## 2017-08-19 ENCOUNTER — Ambulatory Visit
Admission: RE | Admit: 2017-08-19 | Discharge: 2017-08-19 | Disposition: A | Discharge status: Home / Self Care | Payer: Managed Care, Other (non HMO) | Source: Ambulatory Visit | Attending: Radiation Oncology | Admitting: Radiation Oncology

## 2017-08-19 DIAGNOSIS — Z510 Encounter for antineoplastic radiation therapy: Secondary | ICD-10-CM | POA: Diagnosis not present

## 2017-08-20 ENCOUNTER — Ambulatory Visit
Admission: RE | Admit: 2017-08-20 | Discharge: 2017-08-20 | Disposition: A | Discharge status: Home / Self Care | Payer: Managed Care, Other (non HMO) | Source: Ambulatory Visit | Attending: Radiation Oncology | Admitting: Radiation Oncology

## 2017-08-20 DIAGNOSIS — Z510 Encounter for antineoplastic radiation therapy: Secondary | ICD-10-CM | POA: Diagnosis not present

## 2017-08-21 ENCOUNTER — Ambulatory Visit
Admission: RE | Admit: 2017-08-21 | Discharge: 2017-08-21 | Disposition: A | Discharge status: Home / Self Care | Payer: Managed Care, Other (non HMO) | Source: Ambulatory Visit | Attending: Radiation Oncology | Admitting: Radiation Oncology

## 2017-08-21 DIAGNOSIS — Z510 Encounter for antineoplastic radiation therapy: Secondary | ICD-10-CM | POA: Diagnosis not present

## 2017-08-24 ENCOUNTER — Ambulatory Visit
Admission: RE | Admit: 2017-08-24 | Discharge: 2017-08-24 | Disposition: A | Discharge status: Home / Self Care | Payer: Managed Care, Other (non HMO) | Source: Ambulatory Visit | Attending: Radiation Oncology | Admitting: Radiation Oncology

## 2017-08-24 ENCOUNTER — Other Ambulatory Visit: Payer: Self-pay | Admitting: General Surgery

## 2017-08-24 DIAGNOSIS — Z170 Estrogen receptor positive status [ER+]: Principal | ICD-10-CM

## 2017-08-24 DIAGNOSIS — Z510 Encounter for antineoplastic radiation therapy: Secondary | ICD-10-CM | POA: Diagnosis not present

## 2017-08-24 DIAGNOSIS — Z1231 Encounter for screening mammogram for malignant neoplasm of breast: Secondary | ICD-10-CM

## 2017-08-24 DIAGNOSIS — C50211 Malignant neoplasm of upper-inner quadrant of right female breast: Secondary | ICD-10-CM

## 2017-08-24 MED ORDER — SONAFINE EX EMUL
1.0000 "application " | Freq: Once | CUTANEOUS | Status: AC
  Administered 2017-08-24: 1 via TOPICAL

## 2017-08-25 ENCOUNTER — Ambulatory Visit
Admission: RE | Admit: 2017-08-25 | Discharge: 2017-08-25 | Disposition: A | Discharge status: Home / Self Care | Payer: Managed Care, Other (non HMO) | Source: Ambulatory Visit | Attending: Radiation Oncology | Admitting: Radiation Oncology

## 2017-08-25 DIAGNOSIS — Z510 Encounter for antineoplastic radiation therapy: Secondary | ICD-10-CM | POA: Diagnosis not present

## 2017-08-26 ENCOUNTER — Ambulatory Visit
Admission: RE | Admit: 2017-08-26 | Discharge: 2017-08-26 | Disposition: A | Discharge status: Home / Self Care | Payer: Managed Care, Other (non HMO) | Source: Ambulatory Visit | Attending: Radiation Oncology | Admitting: Radiation Oncology

## 2017-08-26 DIAGNOSIS — Z510 Encounter for antineoplastic radiation therapy: Secondary | ICD-10-CM | POA: Diagnosis not present

## 2017-08-27 ENCOUNTER — Ambulatory Visit
Admission: RE | Admit: 2017-08-27 | Discharge: 2017-08-27 | Disposition: A | Discharge status: Home / Self Care | Payer: Managed Care, Other (non HMO) | Source: Ambulatory Visit | Attending: Radiation Oncology | Admitting: Radiation Oncology

## 2017-08-27 DIAGNOSIS — Z510 Encounter for antineoplastic radiation therapy: Secondary | ICD-10-CM | POA: Diagnosis not present

## 2017-08-28 ENCOUNTER — Ambulatory Visit
Admission: RE | Admit: 2017-08-28 | Discharge: 2017-08-28 | Disposition: A | Discharge status: Home / Self Care | Payer: Managed Care, Other (non HMO) | Source: Ambulatory Visit | Attending: Radiation Oncology | Admitting: Radiation Oncology

## 2017-08-28 DIAGNOSIS — Z510 Encounter for antineoplastic radiation therapy: Secondary | ICD-10-CM | POA: Diagnosis not present

## 2017-09-01 ENCOUNTER — Encounter: Payer: Self-pay | Admitting: Radiation Oncology

## 2017-09-01 NOTE — Progress Notes (Signed)
  Radiation Oncology         (336) [phone] ________________________________  Name: [name] MRN: [id number]  Date: 09/01/2017  DOB: 12-06-1955  End of Treatment Note  Diagnosis:   Stage IIA Carcinoma of Right Breast, UIQ, ER+, PR+, Her2-, Grade 3 Cancer Staging Carcinoma of upper-inner quadrant of right breast in female, estrogen receptor positive ([place]) Staging form: Breast, AJCC 8th Edition - Pathologic: Stage IIA (pT2, pN80m, cM0, G3, ER+, PR+, HER2-) - Signed by SEppie Gibson MD on 07/08/2017   Indication for treatment:  Curative       Radiation treatment dates:   07/21/17 - 08/28/17  Site/dose:   1. Chest Wall, Right/ total of 50.4 Gy delivered in 28 fractions 2. Nodes, Right/ total of 50.4 Gy delivered in 28 fractions  Beams/energy:   1. 3D, Photons/  6X 2. 3D, Photons/ 10X, 6X   Narrative: The patient tolerated radiation treatment relatively well. By the end of treatment, she reported experiencing fatigue, hyperpigmentation, reddening and peeling to radiation sites, and itching and tenderness with some numbness to mastectomy site.    Plan: The patient has completed radiation treatment. The patient will return to radiation oncology clinic for routine followup in one month. I advised them to call or return sooner if they have any questions or concerns related to their recovery or treatment.  -----------------------------------  SEppie Gibson MD  This document serves as a record of services personally performed by SEppie Gibson MD. It was created on his behalf by KWilburn Mylar a trained medical scribe. The creation of this record is based on the scribe's personal observations and the provider's statements to them. This document has been checked and approved by the attending provider.

## 2017-09-08 ENCOUNTER — Other Ambulatory Visit: Payer: Self-pay | Admitting: Internal Medicine

## 2017-09-08 DIAGNOSIS — F988 Other specified behavioral and emotional disorders with onset usually occurring in childhood and adolescence: Secondary | ICD-10-CM

## 2017-09-08 MED ORDER — AMPHETAMINE-DEXTROAMPHETAMINE 20 MG PO TABS: ORAL_TABLET | ORAL | 0 refills | Status: DC

## 2017-09-14 ENCOUNTER — Ambulatory Visit: Payer: Managed Care, Other (non HMO)

## 2017-09-21 ENCOUNTER — Other Ambulatory Visit: Payer: Self-pay | Admitting: General Surgery

## 2017-09-22 ENCOUNTER — Ambulatory Visit
Admission: RE | Admit: 2017-09-22 | Discharge: 2017-09-22 | Disposition: A | Discharge status: Home / Self Care | Payer: Managed Care, Other (non HMO) | Source: Ambulatory Visit | Attending: General Surgery | Admitting: General Surgery

## 2017-09-22 DIAGNOSIS — Z1231 Encounter for screening mammogram for malignant neoplasm of breast: Secondary | ICD-10-CM

## 2017-09-23 ENCOUNTER — Encounter: Payer: Self-pay | Admitting: Radiation Oncology

## 2017-09-23 NOTE — Progress Notes (Signed)
Long term disability forms completed and taken to [facility] in [place] to print medical records needed for Disability. I have asked them to fax all records and disability forms to [place] at [phone].

## 2017-09-28 ENCOUNTER — Other Ambulatory Visit: Payer: Self-pay

## 2017-09-28 DIAGNOSIS — F419 Anxiety disorder, unspecified: Secondary | ICD-10-CM

## 2017-09-28 MED ORDER — ALPRAZOLAM 0.5 MG PO TABS: ORAL_TABLET | ORAL | 0 refills | Status: DC

## 2017-09-29 ENCOUNTER — Inpatient Hospital Stay: Admission: RE | Admit: 2017-09-29 | Payer: Self-pay | Source: Ambulatory Visit | Admitting: Radiation Oncology

## 2017-10-01 ENCOUNTER — Telehealth: Payer: Self-pay | Admitting: Oncology

## 2017-10-01 NOTE — Telephone Encounter (Signed)
lft vm to schedule

## 2017-10-01 NOTE — Progress Notes (Signed)
Complete Physical  Assessment and Plan: Essential hypertension -  DASH diet, exercise and monitor at home. Call if greater than 130/80.  - EKG 12-Lead  NASH (nonalcoholic steatohepatitis) Check labs, avoid tylenol, alcohol, weight loss advised.   Hepatitis C virus infection without hepatic coma, unspecified chronicity S/p treatment   Hyperlipidemia -continue medications, check lipids, decrease fatty foods, increase activity.  Colitis No symptoms  Osteoporosis Get DEXA 2 years GOING TO GET SHOTS - ON [place]  Breast Cancer history GETTING MASTECTOMY ON FEMERA, DONE WITH TREATMENTS- being followed  Depression with anxiety Continue wellbutrin   ADD (attention deficit disorder) Continue medication  Encounter for general adult medical examination with abnormal findings - PAP THIS YEAR  Medication management  Vitamin D deficiency Continue supplement  Elevated ferritin -     Iron,Total/Total Iron Binding Cap  Insomnia, unspecified type - discussed getting off benzo with patient  Chronic anxiety -     ALPRAZolam (XANAX) 0.5 MG tablet; Take 1/2 to 1 tablet 1 to 2 x/ day only if needed for Anxiety attack  Screening cervical cancer Pap sent off  Discussed med's effects and SE's. Screening labs and tests as requested with regular follow-up as recommended.  HPI 62 y.o. female  presents for a complete physical.  Her blood pressure has not been controlled at home,  today he BP is BP: 112/60 She does workout, but due to the heat has not worked out as much. She denies chest pain, shortness of breath, dizziness.  New grandson Fin 11.4 months old and waiting on a granddaughter in Oct.  She has a history of breast cancer, found to have left breast cancer and is scheduled for total left mastectomy 09/09, has finished her chemo and radiation.  She is not on cholesterol medication and denies myalgias. Her cholesterol is not at goal. The cholesterol last visit was:   Lab Results   Component Value Date   CHOL 281 (H) 10/07/2016   HDL 119 10/07/2016   LDLCALC 144 (H) 10/07/2016   TRIG 81 10/07/2016   CHOLHDL 2.4 10/07/2016   Lab Results  Component Value Date   HGBA1C 5.3 09/29/2013   Patient is on Vitamin D supplement, on 5000 IU daily.    Lab Results  Component Value Date   VD25OH 50 10/07/2016     She has a history of hepatitis C, has completed treatment with harvoni. And she has a history of NASH via CT AB in 2015.  Lab Results  Component Value Date   ALT 16 10/07/2016   AST 22 10/07/2016   ALKPHOS 98 04/09/2016   BILITOT 0.6 10/07/2016   Patient is on an ADD medication, she states that the medication is helping and she denies any adverse reactions.   She is on wellbutrin and xanax for anxiety/depression.     Current Medications:  Current Outpatient Medications on File Prior to Visit  Medication Sig Dispense Refill  . ALPRAZolam (XANAX) 0.5 MG tablet Take 1/2 to 1 tablet 1 to 2  x /day & please limit use to 5 days /week to avoid addiction (Patient taking differently: Take 0.25-0.5 mg by mouth 2 (two) times daily as needed for anxiety. ) 60 tablet 0  . amphetamine-dextroamphetamine (ADDERALL) 20 MG tablet Take 1/2 to 1 tablet 1 or 2 x/day ONLY if needed for ADD. Limit use to 5 days/week (Patient taking differently: Take 20 mg by mouth daily. ) 60 tablet 0  . Brimonidine Tartrate (LUMIFY) 0.025 % SOLN Place 1 drop into  both eyes 2 (two) times daily.    Marland Kitchen buPROPion (WELLBUTRIN XL) 300 MG 24 hr tablet TAKE 1 TABLET BY MOUTH EVERY DAY IN THE MORNING (Patient taking differently: Take 300 mg by mouth daily. ) 90 tablet 1  . Cholecalciferol (VITAMIN D3) 5000 units CAPS Take 5,000 Units by mouth daily.    Marland Kitchen gabapentin (NEURONTIN) 100 MG capsule Take 100 mg by mouth 2 (two) times daily.     Marland Kitchen letrozole (FEMARA) 2.5 MG tablet Take 2.5 mg by mouth daily.  3  . lisinopril-hydrochlorothiazide (PRINZIDE,ZESTORETIC) 20-25 MG tablet TAKE 1 TABLET BY MOUTH EVERY DAY  90 tablet 1  . MILK THISTLE PO Take 1 tablet by mouth daily.    . pilocarpine (PILOCAR) 2 % ophthalmic solution Use 4-5 drops in 4 oz of water up to 4 x a day 15 mL 12  . Probiotic Product (PROBIOTIC DAILY PO) Take 1 tablet by mouth daily.    . traZODone (DESYREL) 50 MG tablet 1-3 tablets for sleep (Patient taking differently: Take 50 mg by mouth at bedtime. ) 270 tablet 0   No current facility-administered medications on file prior to visit.    Health Maintenance:   Immunization History  Administered Date(s) Administered  . Tdap 09/19/2008   Tetanus: 2010 Pneumovax: N/A Prenvar 13: N/A Flu vaccine: 2017 at work Zostavax: pending Hep A&B: has had 2 of the series.   Pap: 2016 normal history of LEEP in 1994,  DUE 2019 3DMGM: 07/2016 with implants DEXA: 07/2016 osteoporosis, STOPPED FOSAMAX WILL START PROLIA AFTER SURGERY Colonoscopy: 09/25/2013- with Dr. Alice Reichert  EGD: CTAB 2015: NASH and Colitis Echo 2011: normal EF 60-65%  Eye: Dr. Gershon Crane Dentist: Dr. Benito Mccreedy  Medical History:  Past Medical History:  Diagnosis Date  . ADD (attention deficit disorder)   . Breast cancer ([place]) 2004   rt breast  . Cancer ([place]) 2004   Breast   . Hepatitis C 09/2012   Genotype 1a, sent to [facility]  . History of radiation therapy 11/21/02- 01/06/03   Right Breast 5,040 cGy in 28 fractions. the tumor bed within the breast area was boosted further to a dose of 6,240 cGy  . History of radiation therapy 07/21/17- 08/28/17   Right Chest wall 50.4 Gy delivered in 1.86 Gy in 28 fractions. Right Chest wall, nodes 50.4 Gy delivered in 1.86 Gy in 28 fractions.   . Hyperlipidemia   . Hypertension   . Personal history of radiation therapy 2004   rt breast   Allergies No Known Allergies  SURGICAL HISTORY She  has a past surgical history that includes LEEP (1994); Tubal ligation (2004); Ovarian cyst removal (Left, 2004); Augmentation mammaplasty (Bilateral, 2007); and Breast lumpectomy (Right,  2004). FAMILY HISTORY Her family history includes Cancer in her mother and sister; Hypertension in her father; Stroke in her father. SOCIAL HISTORY She  reports that she quit smoking about 29 years ago. Her smoking use included cigarettes. She has never used smokeless tobacco. She reports that she drinks alcohol. She reports that she does not use drugs.   Review of Systems  Constitutional: Negative.   HENT: Negative.   Eyes: Negative.   Respiratory: Negative.   Cardiovascular: Negative.   Gastrointestinal: Negative.   Genitourinary: Negative.   Musculoskeletal: Negative.   Skin: Negative.   Neurological: Negative.   Endo/Heme/Allergies: Negative.   Psychiatric/Behavioral: Negative.    Physical Exam: Estimated body mass index is 22.05 kg/m as calculated from the following:   Height as of this encounter:  5' 1.5" (1.562 m).   Weight as of this encounter: 118 lb 9.6 oz (53.8 kg). BP 112/60   Pulse 100   Temp 97.8 F (36.6 C)   Resp 16   Ht 5' 1.5" (1.562 m)   Wt 118 lb 9.6 oz (53.8 kg)   SpO2 96%   BMI 22.05 kg/m  General Appearance: Well nourished, in no apparent distress. Eyes: PERRLA, EOMs, conjunctiva no swelling or erythema, normal fundi and vessels. Sinuses: No Frontal/maxillary tenderness ENT/Mouth: Ext aud canals clear, normal light reflex with TMs without erythema, bulging.  Good dentition. No erythema, swelling, or exudate on post pharynx. Tonsils not swollen or erythematous. Hearing normal.  Neck: Supple, thyroid normal. No bruits Respiratory: Respiratory effort normal, BS equal bilaterally without rales, rhonchi, wheezing or stridor. Cardio: RRR without murmurs, rubs or gallops. Brisk peripheral pulses without edema.  Chest: symmetric, with normal excursions and percussion. Breasts: defer Abdomen: Soft, +BS. Non tender, no guarding, rebound, hernias, masses, or organomegaly.  Lymphatics: Non tender without lymphadenopathy.  GYN: normal external genitalia, vulva,  vagina, cervix, uterus and adnexa, VAGINA: atrophic. Musculoskeletal: Full ROM all peripheral extremities,5/5 strength, and normal gait. Skin: Warm, dry without rashes, lesions, ecchymosis.  Neuro: Cranial nerves intact, reflexes equal bilaterally. Normal muscle tone, no cerebellar symptoms. Sensation intact.  Psych: Awake and oriented X 3, normal affect, Insight and Judgment appropriate.    Vicie Mutters 9:21 AM

## 2017-10-07 ENCOUNTER — Ambulatory Visit (INDEPENDENT_AMBULATORY_CARE_PROVIDER_SITE_OTHER): Payer: Managed Care, Other (non HMO) | Admitting: Physician Assistant

## 2017-10-07 ENCOUNTER — Other Ambulatory Visit (HOSPITAL_COMMUNITY)
Admission: RE | Admit: 2017-10-07 | Discharge: 2017-10-07 | Disposition: A | Discharge status: Home / Self Care | Payer: Managed Care, Other (non HMO) | Source: Ambulatory Visit | Attending: Physician Assistant | Admitting: Physician Assistant

## 2017-10-07 ENCOUNTER — Encounter: Payer: Self-pay | Admitting: Physician Assistant

## 2017-10-07 VITALS — BP 112/60 | HR 100 | Temp 97.8°F | Resp 16 | Ht 61.5 in | Wt 118.6 lb

## 2017-10-07 DIAGNOSIS — Z853 Personal history of malignant neoplasm of breast: Secondary | ICD-10-CM

## 2017-10-07 DIAGNOSIS — Z79899 Other long term (current) drug therapy: Secondary | ICD-10-CM | POA: Diagnosis not present

## 2017-10-07 DIAGNOSIS — E559 Vitamin D deficiency, unspecified: Secondary | ICD-10-CM

## 2017-10-07 DIAGNOSIS — K7581 Nonalcoholic steatohepatitis (NASH): Secondary | ICD-10-CM | POA: Diagnosis not present

## 2017-10-07 DIAGNOSIS — Z130 Encounter for screening for diseases of the blood and blood-forming organs and certain disorders involving the immune mechanism: Secondary | ICD-10-CM | POA: Insufficient documentation

## 2017-10-07 DIAGNOSIS — Z0000 Encounter for general adult medical examination without abnormal findings: Secondary | ICD-10-CM | POA: Diagnosis not present

## 2017-10-07 DIAGNOSIS — F988 Other specified behavioral and emotional disorders with onset usually occurring in childhood and adolescence: Secondary | ICD-10-CM

## 2017-10-07 DIAGNOSIS — Z1389 Encounter for screening for other disorder: Secondary | ICD-10-CM | POA: Diagnosis not present

## 2017-10-07 DIAGNOSIS — Z170 Estrogen receptor positive status [ER+]: Secondary | ICD-10-CM

## 2017-10-07 DIAGNOSIS — C50211 Malignant neoplasm of upper-inner quadrant of right female breast: Secondary | ICD-10-CM

## 2017-10-07 DIAGNOSIS — Z124 Encounter for screening for malignant neoplasm of cervix: Secondary | ICD-10-CM | POA: Insufficient documentation

## 2017-10-07 DIAGNOSIS — E785 Hyperlipidemia, unspecified: Secondary | ICD-10-CM | POA: Diagnosis not present

## 2017-10-07 DIAGNOSIS — Z136 Encounter for screening for cardiovascular disorders: Secondary | ICD-10-CM

## 2017-10-07 DIAGNOSIS — I10 Essential (primary) hypertension: Secondary | ICD-10-CM | POA: Diagnosis not present

## 2017-10-07 DIAGNOSIS — M8580 Other specified disorders of bone density and structure, unspecified site: Secondary | ICD-10-CM

## 2017-10-07 DIAGNOSIS — Z0001 Encounter for general adult medical examination with abnormal findings: Secondary | ICD-10-CM

## 2017-10-07 DIAGNOSIS — Z13220 Encounter for screening for lipoid disorders: Secondary | ICD-10-CM

## 2017-10-07 DIAGNOSIS — Z1329 Encounter for screening for other suspected endocrine disorder: Secondary | ICD-10-CM

## 2017-10-07 DIAGNOSIS — B1920 Unspecified viral hepatitis C without hepatic coma: Secondary | ICD-10-CM

## 2017-10-07 DIAGNOSIS — K529 Noninfective gastroenteritis and colitis, unspecified: Secondary | ICD-10-CM

## 2017-10-07 DIAGNOSIS — F418 Other specified anxiety disorders: Secondary | ICD-10-CM

## 2017-10-07 MED ORDER — MOMETASONE FUROATE 50 MCG/ACT NA SUSP: 2.0000 | Freq: Every day | NASAL | 2 refills | Status: DC

## 2017-10-07 MED ORDER — GABAPENTIN 100 MG PO CAPS: 100.0000 mg | ORAL_CAPSULE | Freq: Two times a day (BID) | ORAL | 4 refills | Status: DC

## 2017-10-07 NOTE — Patient Instructions (Addendum)
VAGINAL DRYNESS OVERVIEW  Vaginal dryness, also known as atrophic vaginitis, is a common condition in postmenopausal women. This condition is also common in women who have had both ovaries removed at the time of hysterectomy.   Some women have uncomfortable symptoms of vaginal dryness, such as pain with sex, burning vaginal discomfort or itching, or abnormal vaginal discharge, while others have no symptoms at all.  VAGINAL DRYNESS CAUSES   Estrogen helps to keep the vagina moist and to maintain thickness of the vaginal lining. Vaginal dryness occurs when the ovaries produce a decreased amount of estrogen. This can occur at certain times in a woman's life, and may be permanent or temporary. Times when less estrogen is made include: ?At the time of menopause. ?After surgical removal of the ovaries, chemotherapy, or radiation therapy of the pelvis for cancer. ?After having a baby, particularly in women who breastfeed. ?While using certain medications, such as danazol, medroxyprogesterone (brand names: Provera or DepoProvera), leuprolide (brand name: Lupron), or nafarelin. When these medications are stopped, estrogen production resumes.  Women who smoke cigarettes have been shown to have an increased risk of an earlier menopause transition as compared to non-smokers. Therefore, atrophic vaginitis symptoms may appear at a younger age in this population.  VAGINAL DRYNESS TREATMENT   There are three treatment options for women with vaginal dryness:  Vaginal lubricants and moisturizers - Vaginal lubricants and moisturizers can be purchased without a prescription. These products do not contain any hormones and have virtually no side effects. - Albolene is found in the facial cleanser section at [facility], [facility], or [employer]. It is a large jar with a blue top. This is the best lubricant for women because it is hypoallergenic. -Natural lubricants, such as olive, avocado or peanut oil, are easily available  products that may be used as a lubricant with sex.  -Vaginal moisturizes (eg, Replens, Moist Again, Vagisil, K-Y Silk-E, and Feminease) are formulated to allow water to be retained in the vaginal tissues. Moisturizers are applied into the vagina three times weekly to allow a continued moisturizing effect. These should not be used just before having sex, as they can be irritating.

## 2017-10-07 NOTE — Pre-Procedure Instructions (Signed)
Noha Milberger [place]  10/07/2017      [id number] [place] [address]Phone: [phone]: [phone]   Your procedure is scheduled on September 9..  [place]  Call this number if you have problems the morning of surgery:  [phone]   Remember:  Do not eat after midnight.  You may drink clear liquids until 4:30am .  Clear liquids allowed are:                    Water, Juice (non-citric and without pulp), Carbonated beverages, Clear Tea, Black Coffee only and Gatorade   Please drink pre-surgery Ensure by 4:30am, the morning of surgery.    Take these medicines the morning of surgery with A SIP OF WATER  ALPRAZolam (XANAX) buPROPion (WELLBUTRIN XL)  gabapentin (NEURONTIN)  mometasone (NASONEX)  Eye drops - if needed  7 days prior to surgery STOP taking any Aspirin(unless otherwise instructed by your surgeon), Aleve, Naproxen, Ibuprofen, Motrin, Advil, Goody's, BC's, all herbal medications, fish oil, and all vitamins      Do not wear jewelry, make-up or nail polish.  Do not wear lotions, powders, or perfumes, or deodorant.  Do not shave 48 hours prior to surgery.    Do not bring valuables to the hospital.  [facility] belongings or [facility]ables.  Contacts, dentures or bridgework may not be worn into surgery.  Leave your suitcase in the car.  After surgery it may be brought to your room.  For patients admitted to the hospital, discharge time will be determined by your treatment team.  Patients discharged the day of surgery will not be allowed to drive home.    Special instructions:   [facility]- Preparing For Surgery  Before surgery, you can play an important role. Because skin is not sterile, your skin needs to be as free of germs as possible. You can reduce the number of germs on your skin by washing with CHG (chlorahexidine  gluconate) Soap before surgery.  CHG is an antiseptic cleaner which kills germs and bonds with the skin to continue killing germs even after washing.    Oral Hygiene is also important to reduce your risk of infection.  Remember - BRUSH YOUR TEETH THE MORNING OF SURGERY WITH YOUR REGULAR TOOTHPASTE  Please do not use if you have an allergy to CHG or antibacterial soaps. If your skin becomes reddened/irritated stop using the CHG.  Do not shave (including legs and underarms) for at least 48 hours prior to first CHG shower. It is OK to shave your face.  Please follow these instructions carefully.   1. Shower the NIGHT BEFORE SURGERY and the MORNING OF SURGERY with CHG.   2. If you chose to wash your hair, wash your hair first as usual with your normal shampoo.  3. After you shampoo, rinse your hair and body thoroughly to remove the shampoo.  4. Use CHG as you would any other liquid soap. You can apply CHG directly to the skin and wash gently with a scrungie or a clean washcloth.   5. Apply the CHG Soap to your body ONLY FROM THE NECK DOWN.  Do not use on open wounds or open sores. Avoid contact with your eyes, ears, mouth and genitals (private parts). Wash Face and genitals (private parts)  with your normal soap.  6. Wash thoroughly,  paying special attention to the area where your surgery will be performed.  7. Thoroughly rinse your body with warm water from the neck down.  8. DO NOT shower/wash with your normal soap after using and rinsing off the CHG Soap.  9. Pat yourself dry with a CLEAN TOWEL.  10. Wear CLEAN PAJAMAS to bed the night before surgery, wear comfortable clothes the morning of surgery  11. Place CLEAN SHEETS on your bed the night of your first shower and DO NOT SLEEP WITH PETS.    Day of Surgery:  Do not apply any deodorants/lotions.  Please wear clean clothes to the hospital/surgery center.   Remember to brush your teeth WITH YOUR REGULAR TOOTHPASTE.    Please  read over the following fact sheets that you were given.

## 2017-10-08 ENCOUNTER — Encounter (HOSPITAL_COMMUNITY): Payer: Self-pay

## 2017-10-08 ENCOUNTER — Other Ambulatory Visit: Payer: Self-pay

## 2017-10-08 ENCOUNTER — Encounter (HOSPITAL_COMMUNITY)
Admission: RE | Admit: 2017-10-08 | Discharge: 2017-10-08 | Disposition: A | Discharge status: Home / Self Care | Payer: Managed Care, Other (non HMO) | Source: Ambulatory Visit | Attending: General Surgery | Admitting: General Surgery

## 2017-10-08 DIAGNOSIS — Z01818 Encounter for other preprocedural examination: Secondary | ICD-10-CM | POA: Diagnosis not present

## 2017-10-08 HISTORY — DX: Family history of other specified conditions: Z84.89

## 2017-10-08 HISTORY — DX: Other specified postprocedural states: Z98.890

## 2017-10-08 HISTORY — DX: Other specified postprocedural states: R11.2

## 2017-10-08 LAB — URINALYSIS, ROUTINE W REFLEX MICROSCOPIC
BACTERIA UA: NONE SEEN /HPF
Bilirubin Urine: NEGATIVE
GLUCOSE, UA: NEGATIVE
HYALINE CAST: NONE SEEN /LPF
Hgb urine dipstick: NEGATIVE
Ketones, ur: NEGATIVE
Nitrite: NEGATIVE
PH: 6 (ref 5.0–8.0)
Protein, ur: NEGATIVE
RBC / HPF: NONE SEEN /HPF (ref 0–2)
Specific Gravity, Urine: 1.02 (ref 1.001–1.03)
Squamous Epithelial / LPF: NONE SEEN /HPF (ref <=5)
WBC, UA: NONE SEEN /HPF (ref 0–5)

## 2017-10-08 LAB — LIPID PANEL
CHOL/HDL RATIO: 2.7 (calc) (ref <5.0)
Cholesterol: 251 mg/dL — ABNORMAL HIGH (ref <200)
HDL: 93 mg/dL (ref >50)
LDL Cholesterol (Calc): 141 mg/dL (calc) — ABNORMAL HIGH
NON-HDL CHOLESTEROL (CALC): 158 mg/dL — AB (ref <130)
TRIGLYCERIDES: 77 mg/dL (ref <150)

## 2017-10-08 LAB — CBC WITH DIFFERENTIAL/PLATELET
BASOS ABS: 18 {cells}/uL (ref 0–200)
BASOS PCT: 0.3 %
EOS PCT: 0.3 %
Eosinophils Absolute: 18 cells/uL (ref 15–500)
HEMATOCRIT: 35.7 % (ref 35.0–45.0)
HEMOGLOBIN: 12.4 g/dL (ref 11.7–15.5)
LYMPHS ABS: 667 {cells}/uL — AB (ref 850–3900)
MCH: 29.9 pg (ref 27.0–33.0)
MCHC: 34.7 g/dL (ref 32.0–36.0)
MCV: 86 fL (ref 80.0–100.0)
MPV: 9.8 fL (ref 7.5–12.5)
Monocytes Relative: 8.7 %
NEUTROS ABS: 4685 {cells}/uL (ref 1500–7800)
Neutrophils Relative %: 79.4 %
Platelets: 330 10*3/uL (ref 140–400)
RBC: 4.15 10*6/uL (ref 3.80–5.10)
RDW: 13.3 % (ref 11.0–15.0)
Total Lymphocyte: 11.3 %
WBC mixed population: 513 cells/uL (ref 200–950)
WBC: 5.9 10*3/uL (ref 3.8–10.8)

## 2017-10-08 LAB — COMPLETE METABOLIC PANEL WITH GFR
AG Ratio: 1.9 (calc) (ref 1.0–2.5)
ALBUMIN MSPROF: 4.7 g/dL (ref 3.6–5.1)
ALT: 12 U/L (ref 6–29)
AST: 16 U/L (ref 10–35)
Alkaline phosphatase (APISO): 84 U/L (ref 33–130)
BUN: 16 mg/dL (ref 7–25)
CALCIUM: 10 mg/dL (ref 8.6–10.4)
CO2: 30 mmol/L (ref 20–32)
Chloride: 98 mmol/L (ref 98–110)
Creat: 0.85 mg/dL (ref 0.50–0.99)
GFR, EST AFRICAN AMERICAN: 86 mL/min/{1.73_m2} (ref >=60)
GFR, EST NON AFRICAN AMERICAN: 74 mL/min/{1.73_m2} (ref >=60)
GLOBULIN: 2.5 g/dL (ref 1.9–3.7)
GLUCOSE: 92 mg/dL (ref 65–99)
Potassium: 4.3 mmol/L (ref 3.5–5.3)
SODIUM: 138 mmol/L (ref 135–146)
TOTAL PROTEIN: 7.2 g/dL (ref 6.1–8.1)
Total Bilirubin: 0.5 mg/dL (ref 0.2–1.2)

## 2017-10-08 LAB — MICROALBUMIN / CREATININE URINE RATIO
Creatinine, Urine: 150 mg/dL (ref 20–275)
MICROALB UR: 0.5 mg/dL
Microalb Creat Ratio: 3 mcg/mg creat (ref <30)

## 2017-10-08 LAB — VITAMIN D 25 HYDROXY (VIT D DEFICIENCY, FRACTURES): VIT D 25 HYDROXY: 65 ng/mL (ref 30–100)

## 2017-10-08 LAB — CYTOLOGY - PAP
DIAGNOSIS: NEGATIVE
HPV: NOT DETECTED

## 2017-10-08 LAB — FERRITIN: FERRITIN: 129 ng/mL (ref 16–288)

## 2017-10-08 LAB — TSH: TSH: 1.66 mIU/L (ref 0.40–4.50)

## 2017-10-08 LAB — MAGNESIUM: MAGNESIUM: 2.1 mg/dL (ref 1.5–2.5)

## 2017-10-08 LAB — IRON, TOTAL/TOTAL IRON BINDING CAP
%SAT: 19 % (ref 16–45)
Iron: 69 ug/dL (ref 45–160)
TIBC: 364 ug/dL (ref 250–450)

## 2017-10-08 NOTE — Progress Notes (Signed)
PCP -  [name]  EKG -  10/07/17 ECHO - 2011   Blood Thinner Instructions: N/A Aspirin Instructions: N/A  Anesthesia review: yes, EKG review requested from PCP   Patient denies shortness of breath, fever, cough and chest pain at PAT appointment   Patient verbalized understanding of instructions that were given to them at the PAT appointment. Patient was also instructed that they will need to review over the PAT instructions again at home before surgery.

## 2017-10-09 ENCOUNTER — Telehealth: Payer: Self-pay | Admitting: Oncology

## 2017-10-09 ENCOUNTER — Encounter: Payer: Self-pay | Admitting: Oncology

## 2017-10-09 NOTE — Telephone Encounter (Signed)
Pt has been cld and scheduled to see Dr. Jana Hakim on 11/8 at [facility]. Pt agreed to the appt date and time. Letter mailed.

## 2017-10-11 NOTE — Anesthesia Preprocedure Evaluation (Addendum)
Anesthesia Evaluation    Reviewed: Allergy & Precautions, Patient's Chart, lab work & pertinent test results  History of Anesthesia Complications (+) PONV  Airway Mallampati: I  TM Distance: >3 FB Neck ROM: Full    Dental no notable dental hx. (+) Teeth Intact, Dental Advisory Given   Pulmonary neg pulmonary ROS, former smoker,    Pulmonary exam normal breath sounds clear to auscultation       Cardiovascular hypertension, Pt. on medications Normal cardiovascular exam Rhythm:Regular Rate:Normal  Echo 2011: normal EF 60-65%   Neuro/Psych Anxiety negative neurological ROS     GI/Hepatic negative GI ROS, (+) Hepatitis -, CNASH   Endo/Other  negative endocrine ROS  Renal/GU negative Renal ROS     Musculoskeletal negative musculoskeletal ROS (+)   Abdominal   Peds  Hematology negative hematology ROS (+)   Anesthesia Other Findings Day of surgery medications reviewed with the patient.  Right breast cancer, now presents for prophylactic left mastectomy  Reproductive/Obstetrics                            Anesthesia Physical Anesthesia Plan  ASA: III  Anesthesia Plan: General and Regional   Post-op Pain Management:  Regional for Post-op pain   Induction: Intravenous  PONV Risk Score and Plan: 4 or greater and Ondansetron, Dexamethasone, Midazolam, Scopolamine patch - Pre-op and TIVA  Airway Management Planned: Oral ETT  Additional Equipment:   Intra-op Plan:   Post-operative Plan: Extubation in OR  Informed Consent: I have reviewed the patients History and Physical, chart, labs and discussed the procedure including the risks, benefits and alternatives for the proposed anesthesia with the patient or authorized representative who has indicated his/her understanding and acceptance.   Dental advisory given  Plan Discussed with: CRNA  Anesthesia Plan Comments:        Anesthesia  Quick Evaluation

## 2017-10-12 ENCOUNTER — Encounter (HOSPITAL_COMMUNITY)
Admission: RE | Disposition: A | Discharge status: Home / Self Care | Payer: Self-pay | Source: Ambulatory Visit | Attending: General Surgery

## 2017-10-12 ENCOUNTER — Other Ambulatory Visit: Payer: Self-pay

## 2017-10-12 ENCOUNTER — Encounter (HOSPITAL_COMMUNITY): Payer: Self-pay

## 2017-10-12 ENCOUNTER — Ambulatory Visit (HOSPITAL_COMMUNITY): Payer: Managed Care, Other (non HMO) | Admitting: Physician Assistant

## 2017-10-12 ENCOUNTER — Ambulatory Visit (HOSPITAL_COMMUNITY): Payer: Managed Care, Other (non HMO) | Admitting: Anesthesiology

## 2017-10-12 ENCOUNTER — Observation Stay (HOSPITAL_COMMUNITY)
Admission: RE | Admit: 2017-10-12 | Discharge: 2017-10-13 | Disposition: A | Discharge status: Home / Self Care | Payer: Managed Care, Other (non HMO) | Source: Ambulatory Visit | Attending: General Surgery | Admitting: General Surgery

## 2017-10-12 DIAGNOSIS — Z79899 Other long term (current) drug therapy: Secondary | ICD-10-CM | POA: Diagnosis not present

## 2017-10-12 DIAGNOSIS — Z9011 Acquired absence of right breast and nipple: Secondary | ICD-10-CM | POA: Insufficient documentation

## 2017-10-12 DIAGNOSIS — Z803 Family history of malignant neoplasm of breast: Secondary | ICD-10-CM | POA: Diagnosis not present

## 2017-10-12 DIAGNOSIS — Z87891 Personal history of nicotine dependence: Secondary | ICD-10-CM | POA: Diagnosis not present

## 2017-10-12 DIAGNOSIS — F419 Anxiety disorder, unspecified: Secondary | ICD-10-CM | POA: Diagnosis not present

## 2017-10-12 DIAGNOSIS — I10 Essential (primary) hypertension: Secondary | ICD-10-CM | POA: Insufficient documentation

## 2017-10-12 DIAGNOSIS — Z1509 Genetic susceptibility to other malignant neoplasm: Secondary | ICD-10-CM

## 2017-10-12 DIAGNOSIS — Z1501 Genetic susceptibility to malignant neoplasm of breast: Secondary | ICD-10-CM | POA: Diagnosis present

## 2017-10-12 DIAGNOSIS — Z809 Family history of malignant neoplasm, unspecified: Secondary | ICD-10-CM | POA: Diagnosis not present

## 2017-10-12 DIAGNOSIS — Z853 Personal history of malignant neoplasm of breast: Secondary | ICD-10-CM | POA: Diagnosis not present

## 2017-10-12 DIAGNOSIS — Z923 Personal history of irradiation: Secondary | ICD-10-CM | POA: Insufficient documentation

## 2017-10-12 HISTORY — PX: TOTAL MASTECTOMY: SHX6129

## 2017-10-12 SURGERY — MASTECTOMY, SIMPLE
Anesthesia: Regional | Site: Breast | Laterality: Left

## 2017-10-12 MED ORDER — PROPOFOL 10 MG/ML IV BOLUS
INTRAVENOUS | Status: DC | PRN
  Administered 2017-10-12: 100 mg via INTRAVENOUS

## 2017-10-12 MED ORDER — SCOPOLAMINE 1 MG/3DAYS TD PT72
MEDICATED_PATCH | TRANSDERMAL | Status: AC
  Filled 2017-10-12: qty 1

## 2017-10-12 MED ORDER — PROPOFOL 10 MG/ML IV BOLUS
INTRAVENOUS | Status: AC
  Filled 2017-10-12: qty 40

## 2017-10-12 MED ORDER — ACETAMINOPHEN 500 MG PO TABS
ORAL_TABLET | ORAL | Status: AC
  Administered 2017-10-12: 1000 mg via ORAL
  Filled 2017-10-12: qty 2

## 2017-10-12 MED ORDER — OXYCODONE HCL 5 MG PO TABS
5.0000 mg | ORAL_TABLET | ORAL | Status: DC | PRN
  Administered 2017-10-12 – 2017-10-13 (×5): 10 mg via ORAL
  Filled 2017-10-12 (×5): qty 2

## 2017-10-12 MED ORDER — ENSURE PRE-SURGERY PO LIQD
296.0000 mL | Freq: Once | ORAL | Status: DC
  Filled 2017-10-12: qty 296

## 2017-10-12 MED ORDER — ONDANSETRON HCL 4 MG/2ML IJ SOLN: 4.0000 mg | Freq: Four times a day (QID) | INTRAMUSCULAR | Status: DC | PRN

## 2017-10-12 MED ORDER — ACETAMINOPHEN 500 MG PO TABS
1000.0000 mg | ORAL_TABLET | ORAL | Status: AC
  Administered 2017-10-12: 1000 mg via ORAL

## 2017-10-12 MED ORDER — LIDOCAINE 2% (20 MG/ML) 5 ML SYRINGE
INTRAMUSCULAR | Status: AC
  Filled 2017-10-12: qty 5

## 2017-10-12 MED ORDER — LISINOPRIL-HYDROCHLOROTHIAZIDE 20-25 MG PO TABS: 1.0000 | ORAL_TABLET | Freq: Every day | ORAL | Status: DC

## 2017-10-12 MED ORDER — METOCLOPRAMIDE HCL 5 MG/ML IJ SOLN
INTRAMUSCULAR | Status: AC
  Filled 2017-10-12: qty 2

## 2017-10-12 MED ORDER — MIDAZOLAM HCL 2 MG/2ML IJ SOLN
INTRAMUSCULAR | Status: DC | PRN
  Administered 2017-10-12: 2 mg via INTRAVENOUS

## 2017-10-12 MED ORDER — DEXAMETHASONE SODIUM PHOSPHATE 10 MG/ML IJ SOLN
INTRAMUSCULAR | Status: DC | PRN
  Administered 2017-10-12: 10 mg via INTRAVENOUS

## 2017-10-12 MED ORDER — ACETAMINOPHEN 500 MG PO TABS
1000.0000 mg | ORAL_TABLET | Freq: Four times a day (QID) | ORAL | Status: DC
  Administered 2017-10-12 – 2017-10-13 (×4): 1000 mg via ORAL
  Filled 2017-10-12 (×4): qty 2

## 2017-10-12 MED ORDER — BUPROPION HCL ER (XL) 150 MG PO TB24: 300.0000 mg | ORAL_TABLET | Freq: Every day | ORAL | Status: DC

## 2017-10-12 MED ORDER — SIMETHICONE 80 MG PO CHEW: 40.0000 mg | CHEWABLE_TABLET | Freq: Four times a day (QID) | ORAL | Status: DC | PRN

## 2017-10-12 MED ORDER — SODIUM CHLORIDE 0.9 % IV SOLN
INTRAVENOUS | Status: DC
  Administered 2017-10-12 – 2017-10-13 (×2): via INTRAVENOUS

## 2017-10-12 MED ORDER — ONDANSETRON HCL 4 MG/2ML IJ SOLN
INTRAMUSCULAR | Status: AC
  Filled 2017-10-12: qty 2

## 2017-10-12 MED ORDER — MORPHINE SULFATE (PF) 2 MG/ML IV SOLN: 2.0000 mg | INTRAVENOUS | Status: DC | PRN

## 2017-10-12 MED ORDER — POLYETHYLENE GLYCOL 3350 17 G PO PACK: 17.0000 g | PACK | Freq: Every day | ORAL | Status: DC | PRN

## 2017-10-12 MED ORDER — METHOCARBAMOL 500 MG PO TABS: 500.0000 mg | ORAL_TABLET | Freq: Four times a day (QID) | ORAL | Status: DC | PRN

## 2017-10-12 MED ORDER — FENTANYL CITRATE (PF) 250 MCG/5ML IJ SOLN
INTRAMUSCULAR | Status: AC
  Filled 2017-10-12: qty 5

## 2017-10-12 MED ORDER — LACTATED RINGERS IV SOLN
INTRAVENOUS | Status: DC | PRN
  Administered 2017-10-12: 07:00:00 via INTRAVENOUS

## 2017-10-12 MED ORDER — GABAPENTIN 100 MG PO CAPS
100.0000 mg | ORAL_CAPSULE | ORAL | Status: AC
  Administered 2017-10-12: 100 mg via ORAL

## 2017-10-12 MED ORDER — HYDROCHLOROTHIAZIDE 25 MG PO TABS: 25.0000 mg | ORAL_TABLET | Freq: Every day | ORAL | Status: DC

## 2017-10-12 MED ORDER — LIDOCAINE 2% (20 MG/ML) 5 ML SYRINGE
INTRAMUSCULAR | Status: DC | PRN
  Administered 2017-10-12: 60 mg via INTRAVENOUS

## 2017-10-12 MED ORDER — CEFAZOLIN SODIUM-DEXTROSE 2-4 GM/100ML-% IV SOLN
INTRAVENOUS | Status: AC
  Filled 2017-10-12: qty 100

## 2017-10-12 MED ORDER — 0.9 % SODIUM CHLORIDE (POUR BTL) OPTIME
TOPICAL | Status: DC | PRN
  Administered 2017-10-12: 1000 mL

## 2017-10-12 MED ORDER — FENTANYL CITRATE (PF) 100 MCG/2ML IJ SOLN
INTRAMUSCULAR | Status: AC
  Filled 2017-10-12: qty 2

## 2017-10-12 MED ORDER — ROCURONIUM BROMIDE 10 MG/ML (PF) SYRINGE
PREFILLED_SYRINGE | INTRAVENOUS | Status: DC | PRN
  Administered 2017-10-12: 40 mg via INTRAVENOUS
  Administered 2017-10-12: 10 mg via INTRAVENOUS

## 2017-10-12 MED ORDER — ONDANSETRON HCL 4 MG/2ML IJ SOLN
INTRAMUSCULAR | Status: DC | PRN
  Administered 2017-10-12: 4 mg via INTRAVENOUS

## 2017-10-12 MED ORDER — PHENYLEPHRINE 40 MCG/ML (10ML) SYRINGE FOR IV PUSH (FOR BLOOD PRESSURE SUPPORT)
PREFILLED_SYRINGE | INTRAVENOUS | Status: AC
  Filled 2017-10-12: qty 10

## 2017-10-12 MED ORDER — ONDANSETRON 4 MG PO TBDP: 4.0000 mg | ORAL_TABLET | Freq: Four times a day (QID) | ORAL | Status: DC | PRN

## 2017-10-12 MED ORDER — LISINOPRIL 20 MG PO TABS: 20.0000 mg | ORAL_TABLET | Freq: Every day | ORAL | Status: DC

## 2017-10-12 MED ORDER — FENTANYL CITRATE (PF) 250 MCG/5ML IJ SOLN
INTRAMUSCULAR | Status: DC | PRN
  Administered 2017-10-12: 100 ug via INTRAVENOUS

## 2017-10-12 MED ORDER — SODIUM CHLORIDE 0.9 % IV SOLN
INTRAVENOUS | Status: DC | PRN
  Administered 2017-10-12: 50 ug/min via INTRAVENOUS

## 2017-10-12 MED ORDER — TRAZODONE HCL 50 MG PO TABS
50.0000 mg | ORAL_TABLET | Freq: Every evening | ORAL | Status: DC | PRN
  Administered 2017-10-12: 50 mg via ORAL
  Filled 2017-10-12: qty 1

## 2017-10-12 MED ORDER — SCOPOLAMINE 1 MG/3DAYS TD PT72
MEDICATED_PATCH | TRANSDERMAL | Status: DC | PRN
  Administered 2017-10-12: 1 via TRANSDERMAL

## 2017-10-12 MED ORDER — DEXAMETHASONE SODIUM PHOSPHATE 10 MG/ML IJ SOLN
INTRAMUSCULAR | Status: AC
  Filled 2017-10-12: qty 1

## 2017-10-12 MED ORDER — PHENYLEPHRINE 40 MCG/ML (10ML) SYRINGE FOR IV PUSH (FOR BLOOD PRESSURE SUPPORT)
PREFILLED_SYRINGE | INTRAVENOUS | Status: DC | PRN
  Administered 2017-10-12: 80 ug via INTRAVENOUS
  Administered 2017-10-12: 120 ug via INTRAVENOUS

## 2017-10-12 MED ORDER — GABAPENTIN 100 MG PO CAPS
100.0000 mg | ORAL_CAPSULE | Freq: Two times a day (BID) | ORAL | Status: DC
  Administered 2017-10-12: 100 mg via ORAL
  Filled 2017-10-12: qty 1

## 2017-10-12 MED ORDER — FENTANYL CITRATE (PF) 100 MCG/2ML IJ SOLN
25.0000 ug | INTRAMUSCULAR | Status: DC | PRN
  Administered 2017-10-12: 25 ug via INTRAVENOUS

## 2017-10-12 MED ORDER — ROPIVACAINE HCL 5 MG/ML IJ SOLN
INTRAMUSCULAR | Status: DC | PRN
  Administered 2017-10-12: 30 mL via PERINEURAL

## 2017-10-12 MED ORDER — DIPHENHYDRAMINE HCL 50 MG/ML IJ SOLN
INTRAMUSCULAR | Status: DC | PRN
  Administered 2017-10-12: 25 mg via INTRAVENOUS

## 2017-10-12 MED ORDER — GABAPENTIN 100 MG PO CAPS
ORAL_CAPSULE | ORAL | Status: AC
  Administered 2017-10-12: 100 mg via ORAL
  Filled 2017-10-12: qty 1

## 2017-10-12 MED ORDER — SUGAMMADEX SODIUM 200 MG/2ML IV SOLN
INTRAVENOUS | Status: DC | PRN
  Administered 2017-10-12: 200 mg via INTRAVENOUS

## 2017-10-12 MED ORDER — DIPHENHYDRAMINE HCL 50 MG/ML IJ SOLN
INTRAMUSCULAR | Status: AC
  Filled 2017-10-12: qty 1

## 2017-10-12 MED ORDER — ROCURONIUM BROMIDE 50 MG/5ML IV SOSY
PREFILLED_SYRINGE | INTRAVENOUS | Status: AC
  Filled 2017-10-12: qty 5

## 2017-10-12 MED ORDER — PROPOFOL 500 MG/50ML IV EMUL
INTRAVENOUS | Status: DC | PRN
  Administered 2017-10-12: 150 ug/kg/min via INTRAVENOUS

## 2017-10-12 MED ORDER — CEFAZOLIN SODIUM-DEXTROSE 2-4 GM/100ML-% IV SOLN
2.0000 g | INTRAVENOUS | Status: AC
  Administered 2017-10-12: 2 g via INTRAVENOUS

## 2017-10-12 MED ORDER — MIDAZOLAM HCL 2 MG/2ML IJ SOLN
INTRAMUSCULAR | Status: AC
  Filled 2017-10-12: qty 2

## 2017-10-12 MED ORDER — AMPHETAMINE-DEXTROAMPHETAMINE 10 MG PO TABS
20.0000 mg | ORAL_TABLET | Freq: Every day | ORAL | Status: DC
Start: 2017-10-12 — End: 2017-10-13
  Administered 2017-10-12: 20 mg via ORAL
  Filled 2017-10-12: qty 2

## 2017-10-12 MED ORDER — ALPRAZOLAM 0.25 MG PO TABS
0.2500 mg | ORAL_TABLET | Freq: Two times a day (BID) | ORAL | Status: DC | PRN
  Administered 2017-10-12: 0.25 mg via ORAL
  Filled 2017-10-12: qty 1

## 2017-10-12 MED ORDER — METOCLOPRAMIDE HCL 5 MG/ML IJ SOLN
INTRAMUSCULAR | Status: DC | PRN
  Administered 2017-10-12: 5 mg via INTRAVENOUS

## 2017-10-12 SURGICAL SUPPLY — 37 items
ADH SKN CLS APL DERMABOND .7 (GAUZE/BANDAGES/DRESSINGS) ×1
BINDER BREAST LRG (GAUZE/BANDAGES/DRESSINGS) ×2 IMPLANT
BINDER BREAST XLRG (GAUZE/BANDAGES/DRESSINGS) IMPLANT
BIOPATCH RED 1 DISK 7.0 (GAUZE/BANDAGES/DRESSINGS) ×2 IMPLANT
BIOPATCH RED 1IN DISK 7.0MM (GAUZE/BANDAGES/DRESSINGS) ×1
CHLORAPREP W/TINT 26ML (MISCELLANEOUS) ×3 IMPLANT
CLOSURE WOUND 1/2 X4 (GAUZE/BANDAGES/DRESSINGS) ×2
COVER SURGICAL LIGHT HANDLE (MISCELLANEOUS) ×3 IMPLANT
DERMABOND ADVANCED (GAUZE/BANDAGES/DRESSINGS) ×2
DERMABOND ADVANCED .7 DNX12 (GAUZE/BANDAGES/DRESSINGS) ×1 IMPLANT
DRAIN CHANNEL 19F RND (DRAIN) ×3 IMPLANT
DRAPE CHEST BREAST 15X10 FENES (DRAPES) ×3 IMPLANT
DRSG PAD ABDOMINAL 8X10 ST (GAUZE/BANDAGES/DRESSINGS) ×6 IMPLANT
DRSG TEGADERM 4X4.75 (GAUZE/BANDAGES/DRESSINGS) ×3 IMPLANT
ELECT BLADE 4.0 EZ CLEAN MEGAD (MISCELLANEOUS) ×3
ELECT CAUTERY BLADE 6.4 (BLADE) ×3 IMPLANT
ELECT REM PT RETURN 9FT ADLT (ELECTROSURGICAL) ×3
ELECTRODE BLDE 4.0 EZ CLN MEGD (MISCELLANEOUS) ×1 IMPLANT
ELECTRODE REM PT RTRN 9FT ADLT (ELECTROSURGICAL) ×1 IMPLANT
EVACUATOR SILICONE 100CC (DRAIN) ×3 IMPLANT
GLOVE BIO SURGEON STRL SZ7 (GLOVE) ×3 IMPLANT
GLOVE BIO SURGEON STRL SZ7.5 (GLOVE) ×2 IMPLANT
GLOVE BIOGEL PI IND STRL 7.5 (GLOVE) ×1 IMPLANT
GLOVE BIOGEL PI INDICATOR 7.5 (GLOVE) ×4
GOWN STRL REUS W/ TWL LRG LVL3 (GOWN DISPOSABLE) ×3 IMPLANT
GOWN STRL REUS W/TWL LRG LVL3 (GOWN DISPOSABLE) ×9
KIT BASIN OR (CUSTOM PROCEDURE TRAY) ×3 IMPLANT
KIT TURNOVER KIT B (KITS) ×3 IMPLANT
NS IRRIG 1000ML POUR BTL (IV SOLUTION) ×3 IMPLANT
PACK GENERAL/GYN (CUSTOM PROCEDURE TRAY) ×3 IMPLANT
PAD ARMBOARD 7.5X6 YLW CONV (MISCELLANEOUS) ×3 IMPLANT
STRIP CLOSURE SKIN 1/2X4 (GAUZE/BANDAGES/DRESSINGS) ×3 IMPLANT
SUT ETHILON 2 0 FS 18 (SUTURE) ×3 IMPLANT
SUT MNCRL AB 4-0 PS2 18 (SUTURE) ×3 IMPLANT
SUT VIC AB 3-0 SH 18 (SUTURE) ×5 IMPLANT
TOWEL OR 17X24 6PK STRL BLUE (TOWEL DISPOSABLE) ×3 IMPLANT
TOWEL OR 17X26 10 PK STRL BLUE (TOWEL DISPOSABLE) ×3 IMPLANT

## 2017-10-12 NOTE — Transfer of Care (Signed)
Immediate Anesthesia Transfer of Care Note  Patient: [name]  Procedure(s) Performed: LEFT TOTAL MASTECTOMY (Left Breast)  Patient Location: PACU  Anesthesia Type:GA combined with regional for post-op pain  Level of Consciousness: drowsy  Airway & Oxygen Therapy: Patient Spontanous Breathing and Patient connected to nasal cannula oxygen  Post-op Assessment: Report given to RN and Post -op Vital signs reviewed and stable  Post vital signs: Reviewed and stable  Last Vitals:  Vitals Value Taken Time  BP 155/73 10/12/2017  9:05 AM  Temp 36.2 C 10/12/2017  9:05 AM  Pulse 82 10/12/2017  9:05 AM  Resp 12 10/12/2017  9:05 AM  SpO2 100 % 10/12/2017  9:05 AM    Last Pain:  Vitals:   10/12/17 0905  TempSrc:   PainSc: Asleep      Patients Stated Pain Goal: 3 (29/92/42 [id number])  Complications: No apparent anesthesia complications

## 2017-10-12 NOTE — Interval H&P Note (Signed)
History and Physical Interval Note:  10/12/2017 7:26 AM  [name]  has presented today for surgery, with the diagnosis of CHEK2 MUTATION  The various methods of treatment have been discussed with the patient and family. After consideration of risks, benefits and other options for treatment, the patient has consented to  Procedure(s): LEFT TOTAL MASTECTOMY (Left) as a surgical intervention .  The patient's history has been reviewed, patient examined, no change in status, stable for surgery.  I have reviewed the patient's chart and labs.  Questions were answered to the patient's satisfaction.     Rolm Bookbinder

## 2017-10-12 NOTE — Anesthesia Postprocedure Evaluation (Signed)
Anesthesia Post Note  Patient: [name]  Procedure(s) Performed: LEFT TOTAL MASTECTOMY (Left Breast)     Patient location during evaluation: PACU Anesthesia Type: Regional Level of consciousness: awake and alert Pain management: pain level controlled Vital Signs Assessment: post-procedure vital signs reviewed and stable Respiratory status: spontaneous breathing, nonlabored ventilation, respiratory function stable and patient connected to nasal cannula oxygen Cardiovascular status: blood pressure returned to baseline and stable Postop Assessment: no apparent nausea or vomiting Anesthetic complications: no    Last Vitals:  Vitals:   10/12/17 0950 10/12/17 1004  BP: (!) 160/74 (!) 154/72  Pulse: 80 86  Resp: 12 15  Temp:  (!) 36.3 C  SpO2: 98% 100%    Last Pain:  Vitals:   10/12/17 0939  TempSrc:   PainSc: 3                  Artavious Trebilcock L Marcianne Ozbun

## 2017-10-12 NOTE — Anesthesia Procedure Notes (Signed)
Anesthesia Regional Block: Pectoralis block   Pre-Anesthetic Checklist: ,, timeout performed, Correct Patient, Correct Site, Correct Laterality, Correct Procedure, Correct Position, site marked, Risks and benefits discussed,  Surgical consent,  Pre-op evaluation,  At surgeon's request and post-op pain management  Laterality: Left  Prep: Maximum Sterile Barrier Precautions used, chloraprep       Needles:  Injection technique: Single-shot  Needle Type: Echogenic Stimulator Needle     Needle Length: 9cm  Needle Gauge: 22     Additional Needles:   Procedures:,,,, ultrasound used (permanent image in chart),,,,  Narrative:  Start time: 10/12/2017 7:00 AM End time: 10/12/2017 7:15 AM Injection made incrementally with aspirations every 5 mL.  Performed by: Personally  Anesthesiologist: Freddrick March, MD  Additional Notes: Monitors applied. No increased pain on injection. No increased resistance to injection. Injection made in 5cc increments. Good needle visualization. Patient tolerated procedure well.

## 2017-10-12 NOTE — H&P (Signed)
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 she has history of dcis with lumpectomy and likely radiotherapy in 2004. she had bilateral implants I think at that time. no other therapy. she has fh of breast cancer and a number of other cancers at a young age. in 11/18 she was noted to have a recurrence in right breast. she was treated at [facility] 1/19 with total mastectomy. 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 there are satellite skin foci. one sn had micrometastatic diseease. this was er/pr pos and her 2 negative according to notes. 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 she has completed radiotherapy at [facility] with dr [name]. she is doing well from this and really would like to consider rrm on left. she had genetics done after her initial surgery. scans at [place] had no metastatic disease. she has no issues with left breast.   Past Surgical History ([name], [place]; 09/21/2017 9:26 AM) Breast Augmentation  Bilateral. Breast Biopsy  Right. multiple Breast Reconstruction  Bilateral. Mastectomy  Right. Oral Surgery  Sentinel Lymph Node Biopsy  Tonsillectomy   Diagnostic Studies History ([name], [place]; 09/21/2017 9:26 AM) Mammogram  >3 years ago  Allergies ([name], [place]; 09/21/2017 9:27 AM) No Known Drug Allergies [09/21/2017]: Allergies Reconciled   Medication History ([name], [place]; 09/21/2017 9:28 AM) BuPROPion HCl ER (XL) (300MG Tablet ER 24HR, Oral) Active. Lisinopril-Hydrochlorothiazide (20-25MG Tablet, Oral) Active. Gabapentin (100MG Tablet, Oral) Active. Adderall XR (25MG Capsule ER 24HR, Oral) Active. Letrozole (2.5MG Tablet, Oral) Active. Medications Reconciled  Social History ([name], [place]; 09/21/2017 9:26 AM) Alcohol use  Occasional alcohol use. Caffeine use  Carbonated beverages. No drug use  Tobacco use  Former smoker.  Family History ([name], [place]; 09/21/2017 9:26  AM) Breast Cancer  Sister. Cerebrovascular Accident  Father. Hypertension  Father, Sister.  Pregnancy / Birth History ([name], [place]; 09/21/2017 9:26 AM) Age at menarche  40 years. Gravida  1 Maternal age  31-35 Para  1  Other Problems ([name], [place]; 09/21/2017 9:26 AM) Anxiety Disorder  Breast Cancer  Hepatitis  High blood pressure  Lump In Breast    Review of Systems (Kathryn A. Brown RMA; 09/21/2017 9:26 AM) General Not Present- Appetite Loss, Chills, Fatigue, Fever, Night Sweats, Weight Gain and Weight Loss. Skin Not Present- Change in Wart/Mole, Dryness, Hives, Jaundice, New Lesions, Non-Healing Wounds, Rash and Ulcer. HEENT Not Present- Earache, Hearing Loss, Hoarseness, Nose Bleed, Oral Ulcers, Ringing in the Ears, Seasonal Allergies, Sinus Pain, Sore Throat, Visual Disturbances, Wears glasses/contact lenses and Yellow Eyes. Respiratory Not Present- Bloody sputum, Chronic Cough, Difficulty Breathing, Snoring and Wheezing. Breast Not Present- Breast Mass, Breast Pain, Nipple Discharge and Skin Changes. Cardiovascular Not Present- Chest Pain, Difficulty Breathing Lying Down, Leg Cramps, Palpitations, Rapid Heart Rate, Shortness of Breath and Swelling of Extremities. Gastrointestinal Not Present- Abdominal Pain, Bloating, Bloody Stool, Change in Bowel Habits, Chronic diarrhea, Constipation, Difficulty Swallowing, Excessive gas, Gets full quickly at meals, Hemorrhoids, Indigestion, Nausea, Rectal Pain and Vomiting. Female Genitourinary Not Present- Frequency, Nocturia, Painful Urination, Pelvic Pain and Urgency. Musculoskeletal Not Present- Back Pain, Joint Pain, Joint Stiffness, Muscle Pain, Muscle Weakness and Swelling of Extremities. Neurological Not Present- Decreased Memory, Fainting, Headaches, Numbness, Seizures, Tingling, Tremor, Trouble walking and Weakness. Psychiatric Not Present- Anxiety, Bipolar, Change in Sleep Pattern, Depression, Fearful and  Frequent crying. Endocrine Not Present- Cold Intolerance, Excessive Hunger, Hair Changes, Heat Intolerance, Hot flashes  and New Diabetes. Hematology Not Present- Blood Thinners, Easy Bruising, Excessive bleeding, Gland problems, HIV and Persistent Infections.  Vitals (Kathryn A. Brown RMA; 09/21/2017 9:26 AM) 09/21/2017 9:26 AM Weight: 118.2 lb Height: 62in Body Surface Area: 1.53 m Body Mass Index: 21.62 kg/m  Temp.: 98.34F  Pulse: 108 (Regular)  BP: 126/82 (Sitting, Left Arm, Standard) Physical Exam Kathryn Bookbinder MD; 09/21/2017 1:41 PM) General Mental Status-Alert. Head and Neck Trachea-midline. Thyroid Gland Characteristics - normal size and consistency. Eye Sclera/Conjunctiva - Bilateral-No scleral icterus. Chest and Lung Exam Chest and lung exam reveals -quiet, even and easy respiratory effort with no use of accessory muscles and on auscultation, normal breath sounds, no adventitious sounds and normal vocal resonance. Breast Nipples Discharge - Left - None. Breast Lump-No Palpable Breast Mass. Cardiovascular Cardiovascular examination reveals -normal heart sounds, regular rate and rhythm with no murmurs and no digital clubbing, cyanosis, edema, increased warmth or tenderness. Abdomen Note: soft nt Neurologic Neurologic evaluation reveals -alert and oriented x 3 with no impairment of recent or remote memory. Lymphatic Head & Neck General Head & Neck Lymphatics: Bilateral - Description - Normal. Axillary General Axillary Region: Bilateral - Description - Normal. 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 I dont feel necessary to recommend rrm. we had this  discussion and she very much is interested in symmetry and she does not want to continue imaging. she is concerned about her elevated risk on that side. she very much wants to proceed with mastectomy without reconstruction. she understands this is not 100% preventive and there is up to 5% risk of developing cancer after mastectomy. we discussed surgery, recovery and drain as well as risks. will proceed in next month or so per her request

## 2017-10-12 NOTE — Op Note (Signed)
Preoperative diagnosis: CHEK2 mutation Postoperative diagnosis: Same as above Procedure: Left total mastectomy Surgeon: Dr. Serita Grammes Anesthesia: General with pectoral block Estimated blood loss: 20 cc Drains: 19 French Blake drain Complications: None Specimens: Left mastectomy marked short superior, long lateral Sponge and count was correct at completion Decision recovery stable condition  Indications: This a 20 yof who has undergone treatment for right breast cancer at outside facility. She was found later to have a CHEK2 mutation.I counselled her risk reducing mastectomy is not required for this mutation but she desired mastectomy for variety of reasons. She had normal exam and normal mammogram.  We discussed left total mastectomy.  Procedure: After informed consent was obtained the patient first underwent a pectoral block.  She was given antibiotics.  SCDs were in place.  She was then placed under general anesthesia without complication.  She was prepped and draped in the standard sterile surgical fashion.  A surgical timeout was then performed.  I made a very large elliptical incision to encompass all the skin as well as the nipple areolar complex.  I then created flaps to the parasternal region, clavicle, inframammary fold, and latissimus laterally. There was fair amount of scar tissue present from prior reduction. The breast and the pectoralis fascia were then removed from the muscle. This was marked and passed off the table as a specimen.  I then obtained hemostasis.  I then placed a 92 [place] Blake drain and secured this with a 2-0 nylon suture.  A Biopatch and a Tegaderm were placed.  I then closed this with 3-0 Vicryl and 4-0 Monocryl.  Glue and Steri-Strips were placed.  She tolerated this well was extubated and transferred to recovery stable.

## 2017-10-12 NOTE — Discharge Instructions (Signed)
[facility], [place] [phone]  MASTECTOMY: POST OP INSTRUCTIONS Take 400 mg of ibuprofen every 8 hours or 650 mg tylenol every 6 hours for next 72 hours then as needed. Use ice several times daily also. Always review your discharge instruction sheet given to you by the facility where your surgery was performed.  IF YOU HAVE DISABILITY OR FAMILY LEAVE FORMS, YOU MUST BRING THEM TO THE OFFICE FOR PROCESSING.   DO NOT GIVE THEM TO YOUR DOCTOR. A prescription for pain medication may be given to you upon discharge.  Take your pain medication as prescribed, if needed.  If narcotic pain medicine is not needed, then you may take acetaminophen (Tylenol), naprosyn (Alleve) or ibuprofen (Advil) as needed. 1. Take your usually prescribed medications unless otherwise directed. 2. If you need a refill on your pain medication, please contact your pharmacy.  They will contact our office to request authorization.  Prescriptions will not be filled after 5pm or on week-ends. 3. You should follow a light diet the first few days after arrival home, such as soup and crackers, etc.  Resume your normal diet the day after surgery. 4. Most patients will experience some swelling and bruising on the chest and underarm.  Ice packs will help.  Swelling and bruising can take several days to resolve. Wear the binder day and night until you return to the office.  5. It is common to experience some constipation if taking pain medication after surgery.  Increasing fluid intake and taking a stool softener (such as Colace) will usually help or prevent this problem from occurring.  A mild laxative (Milk of Magnesia or Miralax) should be taken according to package instructions if there are no bowel movements after 48 hours. 6. Unless discharge instructions indicate otherwise, leave your bandage dry and in place until your next appointment in 3-5 days.  You may take a limited sponge bath.  No tube baths or showers until the  drains are removed.  You may have steri-strips (small skin tapes) in place directly over the incision.  These strips should be left on the skin for 7-10 days. If you have glue it will come off in next couple week.  Any sutures will be removed at an office visit 7. DRAINS:  If you have drains in place, it is important to keep a list of the amount of drainage produced each day in your drains.  Before leaving the hospital, you should be instructed on drain care.  Call our office if you have any questions about your drains. I will remove your drains when they put out less than 30 cc or ml for 2 consecutive days. 8. ACTIVITIES:  You may resume regular (light) daily activities beginning the next day--such as daily self-care, walking, climbing stairs--gradually increasing activities as tolerated.  You may have sexual intercourse when it is comfortable.  Refrain from any heavy lifting or straining until approved by your doctor. a. You may drive when you are no longer taking prescription pain medication, you can comfortably wear a seatbelt, and you can safely maneuver your car and apply brakes. b. RETURN TO WORK:  __________________________________________________________ 9. You should see your doctor in the office for a follow-up appointment approximately 3-5 days after your surgery.  Your doctors nurse will typically make your follow-up appointment when she calls you with your pathology report.  Expect your pathology report 3-4business days after surgery. 10. OTHER INSTRUCTIONS: ______________________________________________________________________________________________ ____________________________________________________________________________________________ WHEN TO CALL YOUR DR [name]: 1. Fever over 101.0  2. Nausea and/or vomiting 3. Extreme swelling or bruising 4. Continued bleeding from incision. 5. Increased pain, redness, or drainage from the incision. The clinic staff is available to answer your  questions during regular business hours.  Please dont hesitate to call and ask to speak to one of the nurses for clinical concerns.  If you have a medical emergency, go to the nearest emergency room or call 911.  A surgeon from [facility] is always on call at the hospital. [address] [place], [place], [place]  [address] ? [address], [place], [place]   [address] [phone] ? [phone] ? FAX [phone] Web site: [email or web address]

## 2017-10-12 NOTE — Anesthesia Procedure Notes (Signed)
Procedure Name: Intubation Date/Time: 10/12/2017 7:47 AM Performed by: Valda Favia, CRNA Pre-anesthesia Checklist: Patient identified, Emergency Drugs available, Suction available and Patient being monitored Patient Re-evaluated:Patient Re-evaluated prior to induction Oxygen Delivery Method: Circle System Utilized Preoxygenation: Pre-oxygenation with 100% oxygen Induction Type: IV induction Ventilation: Mask ventilation without difficulty Laryngoscope Size: Mac and 4 Grade View: Grade II Tube type: Oral Tube size: 7.0 mm Number of attempts: 1 Airway Equipment and Method: Stylet and Oral airway Placement Confirmation: ETT inserted through vocal cords under direct vision,  positive ETCO2 and breath sounds checked- equal and bilateral Secured at: 22 cm Tube secured with: Tape Dental Injury: Teeth and Oropharynx as per pre-operative assessment

## 2017-10-13 ENCOUNTER — Encounter (HOSPITAL_COMMUNITY): Payer: Self-pay | Admitting: General Surgery

## 2017-10-13 DIAGNOSIS — Z1501 Genetic susceptibility to malignant neoplasm of breast: Secondary | ICD-10-CM | POA: Diagnosis not present

## 2017-10-13 MED ORDER — OXYCODONE HCL 5 MG PO TABS: 5.0000 mg | ORAL_TABLET | ORAL | 0 refills | Status: DC | PRN

## 2017-10-13 NOTE — Plan of Care (Signed)
  Problem: Health Behavior/Discharge Planning: Goal: Ability to manage health-related needs will improve Outcome: Progressing   Problem: Clinical Measurements: Goal: Will remain free from infection Outcome: Progressing   Problem: Activity: Goal: Risk for activity intolerance will decrease Outcome: Progressing   Problem: Nutrition: Goal: Adequate nutrition will be maintained Outcome: Progressing   Problem: Coping: Goal: Level of anxiety will decrease Outcome: Progressing   Problem: Elimination: Goal: Will not experience complications related to bowel motility Outcome: Progressing Goal: Will not experience complications related to urinary retention Outcome: Progressing   Problem: Pain Managment: Goal: General experience of comfort will improve Outcome: Progressing   Problem: Safety: Goal: Ability to remain free from injury will improve Outcome: Progressing   Problem: Education: Goal: Knowledge of disease or condition will improve Outcome: Progressing   Problem: Activity: Goal: Ability to maintain or regain function will improve Outcome: Progressing   Problem: Clinical Measurements: Goal: Postoperative complications will be avoided or minimized Outcome: Progressing   Problem: Self-Concept: Goal: Ability to verbalize positive feelings about self will improve Outcome: Progressing   Problem: Pain Management: Goal: Expressions of feelings of enhanced comfort will increase Outcome: Progressing   Problem: Skin Integrity: Goal: Demonstration of wound healing without infection will improve Outcome: Progressing  Forestine Chute, RN 10/13/2017 12:52 AM

## 2017-10-13 NOTE — Discharge Summary (Signed)
Physician Discharge Summary  Patient ID: [name] MRN: [id number] DOB/AGE: November 05, 1955 62 y.o.  Admit date: 10/12/2017 Discharge date: 10/13/2017  Admission Diagnoses: chek 2 mutation History breast cancer  Discharge Diagnoses:  Active Problems:   Biallelic mutation of CHEK2 gene   Discharged Condition: good  Hospital Course: 66 yof who has undergone treatment for right breast cancer then was found to have CHEK2 mutation.  She desired risk reducing mastectomy although understood this was not necessary.  She underwent surgery which she did well from.  The following morning she is doing well without issues and expected drain output  Consults: None  Significant Diagnostic Studies: none  Treatments: surgery: left total mastectomy  Discharge Exam: Blood pressure 131/66, pulse 90, temperature 98.5 F (36.9 C), temperature source Oral, resp. rate 18, height 5' 1.5" (1.562 m), weight 53.7 kg, SpO2 100 %. Incision/Wound:flaps viable, no hematoma, drain serous  Disposition: Discharge disposition: 01-Home or Self Care        Allergies as of 10/13/2017   No Known Allergies     Medication List    TAKE these medications   ALPRAZolam 0.5 MG tablet Commonly known as:  XANAX Take 1/2 to 1 tablet 1 to 2  x /day & please limit use to 5 days /week to avoid addiction What changed:    how much to take  how to take this  when to take this  reasons to take this  additional instructions   amphetamine-dextroamphetamine 20 MG tablet Commonly known as:  ADDERALL Take 1/2 to 1 tablet 1 or 2 x/day ONLY if needed for ADD. Limit use to 5 days/week What changed:    how much to take  how to take this  when to take this  additional instructions   buPROPion 300 MG 24 hr tablet Commonly known as:  WELLBUTRIN XL TAKE 1 TABLET BY MOUTH EVERY DAY IN THE MORNING What changed:  See the new instructions.   gabapentin 100 MG capsule Commonly known as:  NEURONTIN Take 1 capsule  (100 mg total) by mouth 2 (two) times daily.   letrozole 2.5 MG tablet Commonly known as:  FEMARA Take 2.5 mg by mouth daily.   lisinopril-hydrochlorothiazide 20-25 MG tablet Commonly known as:  PRINZIDE,ZESTORETIC TAKE 1 TABLET BY MOUTH EVERY DAY   LUMIFY 0.025 % Soln Generic drug:  Brimonidine Tartrate Place 1 drop into both eyes 2 (two) times daily.   MILK THISTLE PO Take 1 tablet by mouth daily.   mometasone 50 MCG/ACT nasal spray Commonly known as:  NASONEX Place 2 sprays into the nose daily.   oxyCODONE 5 MG immediate release tablet Commonly known as:  Oxy IR/ROXICODONE Take 1 tablet (5 mg total) by mouth every 4 (four) hours as needed for moderate pain.   pilocarpine 2 % ophthalmic solution Commonly known as:  PILOCAR Use 4-5 drops in 4 oz of water up to 4 x a day   PROBIOTIC DAILY PO Take 1 tablet by mouth daily.   traZODone 50 MG tablet Commonly known as:  DESYREL 1-3 tablets for sleep What changed:    how much to take  how to take this  when to take this  additional instructions   Vitamin D3 5000 units Caps Take 5,000 Units by mouth daily.      Follow-up Information    Rolm Bookbinder, MD In 2 weeks.   Specialty:  General Surgery Contact information: [address] [place] [address] [phone]  Signed: Rolm Bookbinder 10/13/2017, 6:48 AM

## 2017-10-14 NOTE — Progress Notes (Signed)
Ms. Hakeem presents for follow up of radiation completed 08/28/17 to her right chest wall. She had a Left mastectomy on 10/12/17 by Dr. Donne Hazel. She will see Dr. Jana Hakim on 12/21/17 as a new patient transferring from services at [facility]. She is taking Letrozole 2.41m daily. She still has soreness to her left mastectomy site. Her right chest has healed well from radiation. She is not currently using cream, but will complete her radiaplex cream and then use a vitamin e containing lotion.   BP (!) 149/88 (BP Location: Left Leg, Patient Position: Sitting)   Pulse 92   Temp 98.2 F (36.8 C) (Oral)   Resp 18   Ht 5' 2"  (1.575 m)   Wt 119 lb 6 oz (54.1 kg)   SpO2 99%   BMI 21.83 kg/m    Wt Readings from Last 3 Encounters:  10/23/17 119 lb 6 oz (54.1 kg)  10/12/17 118 lb 6.4 oz (53.7 kg)  10/08/17 118 lb 6.4 oz (53.7 kg)

## 2017-10-16 ENCOUNTER — Ambulatory Visit: Payer: Managed Care, Other (non HMO) | Admitting: Radiation Oncology

## 2017-10-23 ENCOUNTER — Encounter: Payer: Self-pay | Admitting: Radiation Oncology

## 2017-10-23 ENCOUNTER — Ambulatory Visit
Admission: RE | Admit: 2017-10-23 | Discharge: 2017-10-23 | Disposition: A | Discharge status: Home / Self Care | Payer: Managed Care, Other (non HMO) | Source: Ambulatory Visit | Attending: Radiation Oncology | Admitting: Radiation Oncology

## 2017-10-23 ENCOUNTER — Other Ambulatory Visit: Payer: Self-pay

## 2017-10-23 VITALS — BP 149/88 | HR 92 | Temp 98.2°F | Resp 18 | Ht 62.0 in | Wt 119.4 lb

## 2017-10-23 DIAGNOSIS — Z79899 Other long term (current) drug therapy: Secondary | ICD-10-CM | POA: Diagnosis not present

## 2017-10-23 DIAGNOSIS — Z79811 Long term (current) use of aromatase inhibitors: Secondary | ICD-10-CM | POA: Diagnosis not present

## 2017-10-23 DIAGNOSIS — Z170 Estrogen receptor positive status [ER+]: Secondary | ICD-10-CM | POA: Diagnosis not present

## 2017-10-23 DIAGNOSIS — C50211 Malignant neoplasm of upper-inner quadrant of right female breast: Secondary | ICD-10-CM | POA: Insufficient documentation

## 2017-10-23 NOTE — Progress Notes (Signed)
Radiation Oncology         [phone] ________________________________  Name: [name] MRN: [id number]  Date: 10/23/2017  DOB: 11-May-1955  Follow-Up Visit Note  Outpatient  CC: [name], MD  [name], MD  Diagnosis and Prior Radiotherapy:    ICD-10-CM   1. Carcinoma of upper-inner quadrant of right breast in female, estrogen receptor positive ([place]) C50.211    Z17.0    Cancer Staging Carcinoma of upper-inner quadrant of right breast in female, estrogen receptor positive ([place]) Staging form: Breast, AJCC 8th Edition - Pathologic: Stage IIA (pT2, pN27m, cM0, G3, ER+, PR+, HER2-) - Signed by SEppie Gibson MD on 07/08/2017  CHIEF COMPLAINT: Here for follow-up and surveillance of right breast cancer  Radiation treatment dates:   07/21/17 - 08/28/17  Site/dose:   1. Chest Wall, Right/ total of 50.4 Gy delivered in 28 fractions 2. Nodes, Right/ total of 50.4 Gy delivered in 28 fractions  Narrative:  The patient returns today for routine follow-up following completion of radiation therapy on 08/28/2017. She is doing well overall. She returns to work on 11/02/2017. She hasn't been placing anything to her radiation site since completion of her treatments. She denies any other symptoms.  Since her last visit to the office, she had a left total mastectomy on 10/12/2017 performed by Dr. WDonne Hazelwith pathology showing: Breast, simple mastectomy, left with benign breast tissue with mild fibrocystic change. Negative for carcinoma.   She has an upcoming appointment with Dr. MJana Hakimon 12/11/2017. She is on Letrozole 2.5 mg daily.                                ALLERGIES:  has No Known Allergies.  Meds: Current Outpatient Medications  Medication Sig Dispense Refill  . ALPRAZolam (XANAX) 0.5 MG tablet Take 1/2 to 1 tablet 1 to 2  x /day & please limit use to 5 days /week to avoid addiction (Patient taking differently: Take 0.25-0.5 mg by mouth 2 (two) times daily as needed for  anxiety. ) 60 tablet 0  . amphetamine-dextroamphetamine (ADDERALL) 20 MG tablet Take 1/2 to 1 tablet 1 or 2 x/day ONLY if needed for ADD. Limit use to 5 days/week (Patient taking differently: Take 20 mg by mouth daily. ) 60 tablet 0  . Brimonidine Tartrate (LUMIFY) 0.025 % SOLN Place 1 drop into both eyes 2 (two) times daily.    .Marland KitchenbuPROPion (WELLBUTRIN XL) 300 MG 24 hr tablet TAKE 1 TABLET BY MOUTH EVERY DAY IN THE MORNING (Patient taking differently: Take 300 mg by mouth daily. ) 90 tablet 1  . Cholecalciferol (VITAMIN D3) 5000 units CAPS Take 5,000 Units by mouth daily.    .Marland Kitchengabapentin (NEURONTIN) 100 MG capsule Take 1 capsule (100 mg total) by mouth 2 (two) times daily. 180 capsule 4  . letrozole (FEMARA) 2.5 MG tablet Take 2.5 mg by mouth daily.  3  . lisinopril-hydrochlorothiazide (PRINZIDE,ZESTORETIC) 20-25 MG tablet TAKE 1 TABLET BY MOUTH EVERY DAY 90 tablet 1  . MILK THISTLE PO Take 1 tablet by mouth daily.    . mometasone (NASONEX) 50 MCG/ACT nasal spray Place 2 sprays into the nose daily. 17 g 2  . Probiotic Product (PROBIOTIC DAILY PO) Take 1 tablet by mouth daily.    . traZODone (DESYREL) 50 MG tablet 1-3 tablets for sleep (Patient taking differently: Take 50 mg by mouth at bedtime. ) 270 tablet 0   No current  facility-administered medications for this encounter.     Physical Findings: The patient is in no acute distress. Patient is alert and oriented.  height is _0  (1.575 m) and weight is 119 lb 6 oz (54.1 kg). Her oral temperature is 98.2 F (36.8 C). Her blood pressure is 149/88 (abnormal) and her pulse is 92. Her respiration is 18 and oxygen saturation is 99%. .    Satisfactory skin healing in radiotherapy fields. Skin: Over her right chest, hyperpigmentation is very subtle. Skin is nice and smooth without dryness. Steri-strips over the left chest wall, scars are healing well.    Lab Findings: Lab Results  Component Value Date   WBC 5.9 10/07/2017   HGB 12.4 10/07/2017     HCT 35.7 10/07/2017   MCV 86.0 10/07/2017   PLT 330 10/07/2017    Radiographic Findings: No results found.  Impression/Plan: Healing well from radiotherapy  Continue skin care with topical Vitamin E Oil and / or lotion for at least 2 more months for further healing.  I encouraged her to continue followup with medical oncology. I will see her back on an as-needed basis. I have encouraged her to call if she has any issues or concerns in the future. I wished her the very best. _____________________________________   Eppie Gibson, MD  This document serves as a record of services personally performed by Eppie Gibson, MD. It was created on her behalf by Steva Colder, a trained medical scribe. The creation of this record is based on the scribe's personal observations and the provider's statements to them. This document has been checked and approved by the attending provider.

## 2017-10-26 ENCOUNTER — Encounter: Payer: Self-pay | Admitting: Radiation Oncology

## 2017-11-03 ENCOUNTER — Telehealth: Payer: Self-pay | Admitting: Oncology

## 2017-11-03 NOTE — Telephone Encounter (Signed)
GM PAL 11/8 - moved new patient appointment from 11/8 to 10/31 @ 4 pm per GM. Spoke with patient.

## 2017-11-09 ENCOUNTER — Ambulatory Visit: Payer: Managed Care, Other (non HMO) | Attending: General Surgery | Admitting: Physical Therapy

## 2017-11-09 ENCOUNTER — Other Ambulatory Visit: Payer: Self-pay

## 2017-11-09 ENCOUNTER — Encounter: Payer: Self-pay | Admitting: Physical Therapy

## 2017-11-09 DIAGNOSIS — M25612 Stiffness of left shoulder, not elsewhere classified: Secondary | ICD-10-CM | POA: Diagnosis present

## 2017-11-09 DIAGNOSIS — R293 Abnormal posture: Secondary | ICD-10-CM | POA: Insufficient documentation

## 2017-11-09 DIAGNOSIS — M25611 Stiffness of right shoulder, not elsewhere classified: Secondary | ICD-10-CM | POA: Diagnosis present

## 2017-11-09 DIAGNOSIS — M6281 Muscle weakness (generalized): Secondary | ICD-10-CM | POA: Diagnosis present

## 2017-11-09 DIAGNOSIS — R600 Localized edema: Secondary | ICD-10-CM | POA: Insufficient documentation

## 2017-11-09 NOTE — Therapy (Signed)
[place], [place], [address] Phone: [phone]   Fax:  [phone]  Physical Therapy Evaluation  Patient Details  Name: [name] MRN: [id number] Date of Birth: 1956-01-01 Referring Provider (PT): Ave Filter Date: 11/09/2017  PT End of Session - 11/09/17 1456    Visit Number  1    Number of Visits  9    Date for PT Re-Evaluation  12/07/17    PT Start Time  1302    PT Stop Time  1344    PT Time Calculation (min)  42 min    Activity Tolerance  Patient tolerated treatment well    Behavior During Therapy  [facility] for tasks assessed/performed       Past Medical History:  Diagnosis Date  . ADD (attention deficit disorder)   . Breast cancer ([place]) 2004   rt breast  . Cancer ([place]) 2004   Breast   . Family history of adverse reaction to anesthesia    Sister gets sick as well  . Hepatitis C 09/2012   Genotype 1a, sent to [facility]  . History of radiation therapy 11/21/02- 01/06/03   Right Breast 5,040 cGy in 28 fractions. the tumor bed within the breast area was boosted further to a dose of 6,240 cGy  . History of radiation therapy 07/21/17- 08/28/17   Right Chest wall 50.4 Gy delivered in 1.86 Gy in 28 fractions. Right Chest wall, nodes 50.4 Gy delivered in 1.86 Gy in 28 fractions.   . Hyperlipidemia   . Hypertension   . Personal history of radiation therapy 2004   rt breast  . PONV (postoperative nausea and vomiting)     Past Surgical History:  Procedure Laterality Date  . AUGMENTATION MAMMAPLASTY Bilateral 2007  . BLADDER SUSPENSION    . BREAST LUMPECTOMY Right 2004  . LEEP  1994  . MASTECTOMY Right    2019  . OVARIAN CYST REMOVAL Left 2004  . TOTAL MASTECTOMY Left 10/12/2017   Procedure: LEFT TOTAL MASTECTOMY;  Surgeon: Rolm Bookbinder, MD;  Location: [place];  Service: General;  Laterality: Left;  . TUBAL LIGATION  2004  . WISDOM TOOTH EXTRACTION      There were no vitals  filed for this visit.   Subjective Assessment - 11/09/17 1304    Subjective  I have tightness in both of my arms when I raise them up. I have tightness across my chest.     Pertinent History  2004 R breast cancer in situ, completed radiation, 2018 R breast cancer and underwent R mastectomy SLNB in 03/03/17, completed chemo and radiation then underwent L mastectomy prophylactically on 10/12/17    Patient Stated Goals  to get full ROM and decrease tightness    Currently in Pain?  No/denies    Pain Score  0-No pain         [facility] PT Assessment - 11/09/17 0001      Assessment   Medical Diagnosis  bilateral mastectomy   R breast cancer   Referring Provider (PT)  Donne Hazel    Onset Date/Surgical Date  10/12/17    Hand Dominance  Right    Prior Therapy  none      Precautions   Precautions  Other (comment)   at risk for lymphedema     Restrictions   Weight Bearing Restrictions  No      Balance Screen   Has the patient fallen in the past 6 months  No  Has the patient had a decrease in activity level because of a fear of falling?   No    Is the patient reluctant to leave their home because of a fear of falling?   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    Leisure  pt reports she used to exercise but has not done it recently      Cognition   Overall Cognitive Status  Within Functional Limits for tasks assessed      Observation/Other Assessments   Observations  scar on left healing but with fibrosis, right scar very tight    Skin Integrity  healing on left      ROM / Strength   AROM / PROM / Strength   AROM      AROM   Overall AROM   Deficits    AROM Assessment Site  Shoulder    Right/Left Shoulder  Right;Left    Right Shoulder Flexion  147 Degrees    Right Shoulder ABduction  137 Degrees    Right Shoulder Internal Rotation  65 Degrees    Right Shoulder External Rotation  90 Degrees    Left Shoulder Flexion  159 Degrees    Left Shoulder ABduction  169 Degrees    Left Shoulder Internal Rotation  72 Degrees    Left Shoulder External Rotation  90 Degrees        LYMPHEDEMA/ONCOLOGY QUESTIONNAIRE - 11/09/17 1321      Type   Cancer Type  right breast cancer      Surgeries   Mastectomy Date  10/12/17   L mastectomy 10/12/17, R 03/03/17   Lumpectomy Date  11/04/02    Sentinel Lymph Node Biopsy Date  03/03/17    Number Lymph Nodes Removed  --   pt not sure says 1      Treatment   Active Chemotherapy Treatment  No    Past Chemotherapy Treatment  Yes    Active Radiation Treatment  No    Past Radiation Treatment  Yes    Current Hormone Treatment  Yes    Drug Name  Femara      Lymphedema Assessments   Lymphedema Assessments  Upper extremities      Right Upper Extremity Lymphedema   15 cm Proximal to Olecranon Process  27.3 cm    Olecranon Process  23 cm    15 cm Proximal to Ulnar Styloid Process  23 cm    Just Proximal to Ulnar Styloid Process  15.2 cm    Across Hand at [employer]  18.9 cm    At [place] of 2nd Digit  6.1 cm      Left Upper Extremity Lymphedema   15 cm Proximal to Olecranon Process  27 cm    Olecranon Process  23.3 cm    15 cm Proximal to Ulnar Styloid Process  22.6 cm    Just Proximal to Ulnar Styloid Process  15.5 cm    Across Hand  at [employer]  19.8 cm    At [place] of 2nd Digit  6 cm             Objective measurements completed on examination: See above findings.              PT Education - 11/09/17 1455    Education Details  ABC class, anatomy and physiology of lymphatic system, lymphedema risk reduction    Person(s)  Educated  Patient    Methods  Explanation;Handout    Comprehension  Verbalized understanding          PT Long Term Goals - 11/09/17 1501      PT LONG TERM GOAL #1   Title  Pt will demonstrate 170 degrees of bilateral shoulder flexion to allow her to reach items overhead.    Baseline  R 147, L 159    Time  4    Period  Weeks    Status  New    Target Date  12/07/17      PT LONG TERM GOAL #2   Title  Pt will demonstrate 170 degrees of bilateral shoulder abduction to allow her to reach out to the sides    Baseline  R 137, L 169    Time  4    Period  Weeks    Status  New    Target Date  12/07/17      PT LONG TERM GOAL #3   Title  Pt will be able to independently verbalize lymphedema risk reduction practices    Time  4    Period  Weeks    Status  New    Target Date  12/07/17      PT LONG TERM GOAL #4   Title  Pt will be independent in a home exercise program for continued strengthening and stretching    Time  4    Period  Weeks    Status  New    Target Date  12/07/17      PT LONG TERM GOAL #5   Title  Pt will report a 50% improvement in feelings of tightness across chest with UE movement to allow improved comfort.    Time  4    Period  Weeks    Status  New    Target Date  12/07/17             Plan - 11/09/17 1456    Clinical Impression Statement  Pt presents to PT with decreased bilateral shoulder ROM and tightness across chest following bilateral mastectomy to treat R breast cancer. She was first diagnosed with breast cancer in 2004 and underwent a lumpectomy, chemo and radiation. She had a recurrence in 2018 and underwent a R mastectomy and radiation in Jan 2019 and a left mastectomy on 10/12/17. Pt has increased tightness on R compared with left due to history of radiation on that side. She was educated about her risk of lymphedema and how to reduce her risk. Pt would benefit from skilled PT services to increase bilateral shoulder ROM, decrease tightness across  chest and educate pt in a home exercise program for continued strengthening and stretching.     History and Personal Factors relevant to plan of care:  previous hx of breast cancer, pt is right handed    Clinical Presentation  Stable    Clinical Presentation due to:  pt has completed treatment    Clinical Decision Making  Low    Rehab Potential  Good    Clinical Impairments Affecting Rehab Potential  hx of radiation    PT Frequency  2x / week    PT Duration  4 weeks    PT Treatment/Interventions  ADLs/Self Care Home Management;Therapeutic exercise;Therapeutic activities;Patient/family education;Manual techniques;Manual lymph drainage;Scar mobilization;Taping;Passive range of motion    PT Next Visit Plan  begin PROM to bilateral shoulders, pulleys, ball, gentle myofascial across scars    PT Home Exercise Plan  supine cane exercises    Consulted and Agree with Plan of Care  Patient       Patient will benefit from skilled therapeutic intervention in order to improve the following deficits and impairments:  Increased fascial restricitons, Decreased scar mobility, Postural dysfunction, Decreased range of motion, Decreased strength, Impaired UE functional use, Increased edema  Visit Diagnosis: Stiffness of right shoulder, not elsewhere classified - Plan: PT plan of care cert/re-cert  Stiffness of left shoulder, not elsewhere classified - Plan: PT plan of care cert/re-cert  Localized edema - Plan: PT plan of care cert/re-cert  Abnormal posture - Plan: PT plan of care cert/re-cert  Muscle weakness (generalized) - Plan: PT plan of care cert/re-cert     Problem List Patient Active Problem List   Diagnosis Date Noted  . 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 Carcinoma of upper-inner quadrant of right breast in female, estrogen receptor positive ([place]) 07/08/2017  . Depression with anxiety 10/02/2014  . ADD (attention deficit disorder) 10/02/2014  . Hepatitis C 09/29/2013  . NASH  (nonalcoholic steatohepatitis) 09/29/2013  . Osteopenia 09/29/2013  . Colitis 08/05/2013  . Hyperlipidemia   . Hypertension   . History of breast cancer     Allyson Sabal [facility] 11/09/2017, 3:06 PM  [place], [place], [address] Phone: [phone]   Fax:  [phone]  Name: [name] MRN: [id number] Date of Birth: 12/05/1955  Manus Gunning, PT 11/09/17 3:06 PM

## 2017-11-09 NOTE — Patient Instructions (Signed)
Shoulder: Flexion (Supine)    With hands shoulder width apart, slowly lower dowel to floor behind head. Do not let elbows bend. Keep back flat. Hold 10-30____ seconds. Repeat _10___ times. Do _2___ sessions per day. CAUTION: Stretch slowly and gently.  Copyright  VHI. All rights reserved.  Shoulder: Abduction (Supine)    With right arm flat on floor, hold dowel in palm. Slowly move arm up to side of head by pushing with opposite arm. Do not let elbow bend. Hold 10-30____ seconds. Repeat __10__ times. Do _2___ sessions per day. Repeat on left. CAUTION: Stretch slowly and gently.  Copyright  VHI. All rights reserved.

## 2017-11-12 ENCOUNTER — Ambulatory Visit: Payer: Managed Care, Other (non HMO) | Admitting: Physical Therapy

## 2017-11-12 ENCOUNTER — Encounter: Payer: Self-pay | Admitting: Physical Therapy

## 2017-11-12 DIAGNOSIS — R293 Abnormal posture: Secondary | ICD-10-CM

## 2017-11-12 DIAGNOSIS — M25611 Stiffness of right shoulder, not elsewhere classified: Secondary | ICD-10-CM

## 2017-11-12 DIAGNOSIS — M25612 Stiffness of left shoulder, not elsewhere classified: Secondary | ICD-10-CM

## 2017-11-12 DIAGNOSIS — M6281 Muscle weakness (generalized): Secondary | ICD-10-CM

## 2017-11-12 NOTE — Therapy (Signed)
[place], [place], [address] Phone: [phone]   Fax:  [phone]  Physical Therapy Treatment  Patient Details  Name: [name] MRN: [id number] Date of Birth: 1955-05-31 Referring Provider (PT): Ave Filter Date: 11/12/2017  PT End of Session - 11/12/17 1346    Visit Number  2    Number of Visits  9    Date for PT Re-Evaluation  12/07/17    PT Start Time  1301    PT Stop Time  1342    PT Time Calculation (min)  41 min    Activity Tolerance  Patient tolerated treatment well    Behavior During Therapy  [facility] for tasks assessed/performed       Past Medical History:  Diagnosis Date  . ADD (attention deficit disorder)   . Breast cancer ([place]) 2004   rt breast  . Cancer ([place]) 2004   Breast   . Family history of adverse reaction to anesthesia    Sister gets sick as well  . Hepatitis C 09/2012   Genotype 1a, sent to [facility]  . History of radiation therapy 11/21/02- 01/06/03   Right Breast 5,040 cGy in 28 fractions. the tumor bed within the breast area was boosted further to a dose of 6,240 cGy  . History of radiation therapy 07/21/17- 08/28/17   Right Chest wall 50.4 Gy delivered in 1.86 Gy in 28 fractions. Right Chest wall, nodes 50.4 Gy delivered in 1.86 Gy in 28 fractions.   . Hyperlipidemia   . Hypertension   . Personal history of radiation therapy 2004   rt breast  . PONV (postoperative nausea and vomiting)     Past Surgical History:  Procedure Laterality Date  . AUGMENTATION MAMMAPLASTY Bilateral 2007  . BLADDER SUSPENSION    . BREAST LUMPECTOMY Right 2004  . LEEP  1994  . MASTECTOMY Right    2019  . OVARIAN CYST REMOVAL Left 2004  . TOTAL MASTECTOMY Left 10/12/2017   Procedure: LEFT TOTAL MASTECTOMY;  Surgeon: Rolm Bookbinder, MD;  Location: [place];  Service: General;  Laterality: Left;  . TUBAL LIGATION  2004  . WISDOM TOOTH EXTRACTION      There were no vitals  filed for this visit.  Subjective Assessment - 11/12/17 1303    Subjective  My shoulders have been doing pretty good. I have been working on them.     Pertinent History  2004 R breast cancer in situ, completed radiation, 2018 R breast cancer and underwent R mastectomy SLNB in 03/03/17, completed chemo and radiation then underwent L mastectomy prophylactically on 10/12/17    Patient Stated Goals  to get full ROM and decrease tightness    Currently in Pain?  No/denies    Pain Score  0-No pain         [facility] PT Assessment - 11/12/17 0001      AROM   Right Shoulder Flexion  154 Degrees    Right Shoulder ABduction  158 Degrees                   [facility] Adult PT Treatment/Exercise - 11/12/17 0001      Shoulder Exercises: Supine   Horizontal ABduction  Strengthening;Both;10 reps    Theraband Level (Shoulder Horizontal ABduction)  Level 2 (Red)    External Rotation  Strengthening;Both;10 reps    Theraband Level (Shoulder External Rotation)  Level 2 (Red)    Flexion  10 reps;Strengthening  narrow and wide grip   Theraband Level (Shoulder Flexion)  Level 2 (Red)    Diagonals  Strengthening;Both;10 reps    Theraband Level (Shoulder Diagonals)  Level 2 (Red)      Shoulder Exercises: Pulleys   Flexion  2 minutes    Flexion Limitations  cues not to lean    ABduction  2 minutes      Shoulder Exercises: Therapy Ball   Flexion  Both;10 reps   with stretch at top   ABduction  Both;10 reps   with stretch at top     Manual Therapy   Manual Therapy  Passive ROM;Myofascial release    Myofascial Release  cross hands to R axilla while moving RUE into flexion    Passive ROM  in supine to bilateral shoulders to patients tolerance in direction of flexion, abduction and D2                  PT Long Term Goals - 11/09/17 1501      PT LONG TERM GOAL #1   Title  Pt will demonstrate 170 degrees of bilateral shoulder flexion to allow her to reach items overhead.    Baseline  R  147, L 159    Time  4    Period  Weeks    Status  New    Target Date  12/07/17      PT LONG TERM GOAL #2   Title  Pt will demonstrate 170 degrees of bilateral shoulder abduction to allow her to reach out to the sides    Baseline  R 137, L 169    Time  4    Period  Weeks    Status  New    Target Date  12/07/17      PT LONG TERM GOAL #3   Title  Pt will be able to independently verbalize lymphedema risk reduction practices    Time  4    Period  Weeks    Status  New    Target Date  12/07/17      PT LONG TERM GOAL #4   Title  Pt will be independent in a home exercise program for continued strengthening and stretching    Time  4    Period  Weeks    Status  New    Target Date  12/07/17      PT LONG TERM GOAL #5   Title  Pt will report a 50% improvement in feelings of tightness across chest with UE movement to allow improved comfort.    Time  4    Period  Weeks    Status  New    Target Date  12/07/17            Plan - 11/12/17 1444    Clinical Impression Statement  Pt demonstrates significant improvement of bilateral shoulder ROM especially on right side. Her right shoulder abduction improved 20 degrees since last visit. Pt has been very compliant with her home exercise program. Instructed pt today in supine scapular exercises and issued these to pt as part of her home exercise program. Will assess indep with these at next visit.     Rehab Potential  Good    Clinical Impairments Affecting Rehab Potential  hx of radiation    PT Frequency  2x / week    PT Treatment/Interventions  ADLs/Self Care Home Management;Therapeutic exercise;Therapeutic activities;Patient/family education;Manual techniques;Manual lymph drainage;Scar mobilization;Taping;Passive range of motion    PT Next Visit Plan  remeasure ROM, pulleys, ball, supine scap, give 3 way shoulder, Strength ABC program?    PT Home Exercise Plan  supine cane exercises, Supine scap    Consulted and Agree with Plan of Care   Patient       Patient will benefit from skilled therapeutic intervention in order to improve the following deficits and impairments:  Increased fascial restricitons, Decreased scar mobility, Postural dysfunction, Decreased range of motion, Decreased strength, Impaired UE functional use, Increased edema  Visit Diagnosis: Stiffness of right shoulder, not elsewhere classified  Stiffness of left shoulder, not elsewhere classified  Abnormal posture  Muscle weakness (generalized)     Problem List Patient Active Problem List   Diagnosis Date Noted  . 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 Carcinoma of upper-inner quadrant of right breast in female, estrogen receptor positive ([place]) 07/08/2017  . Depression with anxiety 10/02/2014  . ADD (attention deficit disorder) 10/02/2014  . Hepatitis C 09/29/2013  . NASH (nonalcoholic steatohepatitis) 09/29/2013  . Osteopenia 09/29/2013  . Colitis 08/05/2013  . Hyperlipidemia   . Hypertension   . History of breast cancer     Allyson Sabal [facility] 11/12/2017, 2:46 PM  [place], [place], [address] Phone: [phone]   Fax:  [phone]  Name: MAYSEL MCCOLM MRN: [id number] Date of Birth: Aug 25, 1955  Manus Gunning, PT 11/12/17 2:46 PM

## 2017-11-12 NOTE — Patient Instructions (Signed)
Over Head Pull: Narrow and Wide Grip   Cancer Rehab [phone]   On back, knees bent, feet flat, band across thighs, elbows straight but relaxed. Pull hands apart (start). Keeping elbows straight, bring arms up and over head, hands toward floor. Keep pull steady on band. Hold 10-30 seconds. Return slowly, keeping pull steady, back to start. Then do same with a wider grip on the band (past shoulder width) Repeat _10__ times. Band color __red____   Side Pull: Double Arm   On back, knees bent, feet flat. Arms perpendicular to body, shoulder level, elbows straight but relaxed. Pull arms out to sides, elbows straight. Resistance band comes across collarbones, hands toward floor. Hold momentarily. Slowly return to starting position. Repeat _10__ times. Band color _red____   Sword   On back, knees bent, feet flat, left hand on left hip, right hand above left. Pull right arm DIAGONALLY (hip to shoulder) across chest. Bring right arm along head toward floor. Hold momentarily. Slowly return to starting position. Repeat _10__ times. Do with left arm. Band color _red_____   Shoulder Rotation: Double Arm   On back, knees bent, feet flat, elbows tucked at sides, bent 90, hands palms up. Pull hands apart and down toward floor, keeping elbows near sides. Hold momentarily. Slowly return to starting position. Repeat _10__ times. Band color __red___

## 2017-11-16 ENCOUNTER — Other Ambulatory Visit: Payer: Self-pay

## 2017-11-16 DIAGNOSIS — F988 Other specified behavioral and emotional disorders with onset usually occurring in childhood and adolescence: Secondary | ICD-10-CM

## 2017-11-16 MED ORDER — AMPHETAMINE-DEXTROAMPHETAMINE 20 MG PO TABS: ORAL_TABLET | ORAL | 0 refills | Status: DC

## 2017-11-17 ENCOUNTER — Encounter: Payer: Self-pay | Admitting: Physical Therapy

## 2017-11-17 ENCOUNTER — Ambulatory Visit: Payer: Managed Care, Other (non HMO) | Admitting: Physical Therapy

## 2017-11-17 ENCOUNTER — Other Ambulatory Visit: Payer: Self-pay

## 2017-11-17 DIAGNOSIS — M25612 Stiffness of left shoulder, not elsewhere classified: Secondary | ICD-10-CM

## 2017-11-17 DIAGNOSIS — M25611 Stiffness of right shoulder, not elsewhere classified: Secondary | ICD-10-CM | POA: Diagnosis not present

## 2017-11-17 DIAGNOSIS — M6281 Muscle weakness (generalized): Secondary | ICD-10-CM

## 2017-11-17 DIAGNOSIS — R293 Abnormal posture: Secondary | ICD-10-CM

## 2017-11-17 NOTE — Therapy (Signed)
[place], [place], [address] Phone: [phone]   Fax:  [phone]  Physical Therapy Treatment  Patient Details  Name: TANGIE STAY MRN: [id number] Date of Birth: 1955/07/23 Referring Provider (PT): Ave Filter Date: 11/17/2017  PT End of Session - 11/17/17 1352    Visit Number  3    Number of Visits  9    Date for PT Re-Evaluation  12/07/17    PT Start Time  1287    PT Stop Time  1348    PT Time Calculation (min)  45 min    Activity Tolerance  Patient tolerated treatment well    Behavior During Therapy  [facility] for tasks assessed/performed       Past Medical History:  Diagnosis Date  . ADD (attention deficit disorder)   . Breast cancer ([place]) 2004   rt breast  . Cancer ([place]) 2004   Breast   . Family history of adverse reaction to anesthesia    Sister gets sick as well  . Hepatitis C 09/2012   Genotype 1a, sent to [facility]  . History of radiation therapy 11/21/02- 01/06/03   Right Breast 5,040 cGy in 28 fractions. the tumor bed within the breast area was boosted further to a dose of 6,240 cGy  . History of radiation therapy 07/21/17- 08/28/17   Right Chest wall 50.4 Gy delivered in 1.86 Gy in 28 fractions. Right Chest wall, nodes 50.4 Gy delivered in 1.86 Gy in 28 fractions.   . Hyperlipidemia   . Hypertension   . Personal history of radiation therapy 2004   rt breast  . PONV (postoperative nausea and vomiting)     Past Surgical History:  Procedure Laterality Date  . AUGMENTATION MAMMAPLASTY Bilateral 2007  . BLADDER SUSPENSION    . BREAST LUMPECTOMY Right 2004  . LEEP  1994  . MASTECTOMY Right    2019  . OVARIAN CYST REMOVAL Left 2004  . TOTAL MASTECTOMY Left 10/12/2017   Procedure: LEFT TOTAL MASTECTOMY;  Surgeon: Rolm Bookbinder, MD;  Location: [place];  Service: General;  Laterality: Left;  . TUBAL LIGATION  2004  . WISDOM TOOTH EXTRACTION      There were no vitals  filed for this visit.  Subjective Assessment - 11/17/17 1305    Subjective  My right shoulder is still a little tight.     Pertinent History  2004 R breast cancer in situ, completed radiation, 2018 R breast cancer and underwent R mastectomy SLNB in 03/03/17, completed chemo and radiation then underwent L mastectomy prophylactically on 10/12/17    Patient Stated Goals  to get full ROM and decrease tightness    Currently in Pain?  No/denies    Pain Score  0-No pain         [facility] PT Assessment - 11/17/17 0001      Assessment   Medical Diagnosis  3      AROM   Right Shoulder Flexion  163 Degrees    Right Shoulder ABduction  151 Degrees                   [facility] Adult PT Treatment/Exercise - 11/17/17 0001      Exercises   Exercises  Shoulder      Shoulder Exercises: Standing   Other Standing Exercises  3 way shoulder x 10 reps each with verbal cues to perform exercise correctly    Other Standing Exercises  Began instructing pt  in Strength After Breast Cancer program: pt did all stretches bilaterally and held them for 60 seconds, therapist demonstrated and pt returned demonstrated      Shoulder Exercises: Pulleys   Flexion  2 minutes    ABduction  2 minutes      Shoulder Exercises: Therapy Ball   Flexion  Both;10 reps   with stretch at top   ABduction  Both;10 reps   with stretch at top     Manual Therapy   Myofascial Release  cross hands to R axilla while moving RUE into flexion    Passive ROM  in supine to bilateral shoulders to patients tolerance in direction of flexion, abduction and D2                  PT Long Term Goals - 11/09/17 1501      PT LONG TERM GOAL #1   Title  Pt will demonstrate 170 degrees of bilateral shoulder flexion to allow her to reach items overhead.    Baseline  R 147, L 159    Time  4    Period  Weeks    Status  New    Target Date  12/07/17      PT LONG TERM GOAL #2   Title  Pt will demonstrate 170 degrees of bilateral  shoulder abduction to allow her to reach out to the sides    Baseline  R 137, L 169    Time  4    Period  Weeks    Status  New    Target Date  12/07/17      PT LONG TERM GOAL #3   Title  Pt will be able to independently verbalize lymphedema risk reduction practices    Time  4    Period  Weeks    Status  New    Target Date  12/07/17      PT LONG TERM GOAL #4   Title  Pt will be independent in a home exercise program for continued strengthening and stretching    Time  4    Period  Weeks    Status  New    Target Date  12/07/17      PT LONG TERM GOAL #5   Title  Pt will report a 50% improvement in feelings of tightness across chest with UE movement to allow improved comfort.    Time  4    Period  Weeks    Status  New    Target Date  12/07/17            Plan - 11/17/17 1353    Clinical Impression Statement  Pt demonstrates further improvement of right shoulder flexion ROM. Instructed pt in 3 way shoulder exercises today and also began instruction in Strength ABC exercises. Made it through all stretches today and will instruct in the rest of the packet at next visit. Pt is progressing towards goals and may be ready for discharge by the end of next week.     Rehab Potential  Good    Clinical Impairments Affecting Rehab Potential  hx of radiation    PT Frequency  2x / week    PT Duration  4 weeks    PT Treatment/Interventions  ADLs/Self Care Home Management;Therapeutic exercise;Therapeutic activities;Patient/family education;Manual techniques;Manual lymph drainage;Scar mobilization;Taping;Passive range of motion    PT Next Visit Plan  remeasure ROM, pulleys, ball, supine scap, give 3 way shoulder as part of HEP, Strength ABC program- continue  to instruct beginning at core exercises- see if pt is indep with stretches    PT Home Exercise Plan  supine cane exercises, Supine scap, stretches from Strength ABC packet       Patient will benefit from skilled therapeutic intervention in  order to improve the following deficits and impairments:  Increased fascial restricitons, Decreased scar mobility, Postural dysfunction, Decreased range of motion, Decreased strength, Impaired UE functional use, Increased edema  Visit Diagnosis: Stiffness of right shoulder, not elsewhere classified  Stiffness of left shoulder, not elsewhere classified  Abnormal posture  Muscle weakness (generalized)     Problem List Patient Active Problem List   Diagnosis Date Noted  . 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 Carcinoma of upper-inner quadrant of right breast in female, estrogen receptor positive ([place]) 07/08/2017  . Depression with anxiety 10/02/2014  . ADD (attention deficit disorder) 10/02/2014  . Hepatitis C 09/29/2013  . NASH (nonalcoholic steatohepatitis) 09/29/2013  . Osteopenia 09/29/2013  . Colitis 08/05/2013  . Hyperlipidemia   . Hypertension   . History of breast cancer     Allyson Sabal [facility] 11/17/2017, 1:56 PM  [place], [place], [address] Phone: [phone]   Fax:  [phone]  Name: CAROLEEN STOERMER MRN: [id number] Date of Birth: 29-Mar-1955  Manus Gunning, PT 11/17/17 1:56 PM

## 2017-11-19 ENCOUNTER — Ambulatory Visit: Payer: Managed Care, Other (non HMO) | Admitting: Physical Therapy

## 2017-11-24 ENCOUNTER — Ambulatory Visit: Payer: Managed Care, Other (non HMO) | Admitting: Physical Therapy

## 2017-11-25 ENCOUNTER — Other Ambulatory Visit: Payer: Self-pay | Admitting: Physician Assistant

## 2017-11-26 ENCOUNTER — Other Ambulatory Visit: Payer: Self-pay

## 2017-11-26 ENCOUNTER — Encounter: Payer: Self-pay | Admitting: Physical Therapy

## 2017-11-26 ENCOUNTER — Ambulatory Visit: Payer: Managed Care, Other (non HMO) | Admitting: Physical Therapy

## 2017-11-26 DIAGNOSIS — M25611 Stiffness of right shoulder, not elsewhere classified: Secondary | ICD-10-CM | POA: Diagnosis not present

## 2017-11-26 DIAGNOSIS — M25612 Stiffness of left shoulder, not elsewhere classified: Secondary | ICD-10-CM

## 2017-11-26 DIAGNOSIS — R293 Abnormal posture: Secondary | ICD-10-CM

## 2017-11-26 DIAGNOSIS — M6281 Muscle weakness (generalized): Secondary | ICD-10-CM

## 2017-11-26 NOTE — Therapy (Signed)
[place], [place], [address] Phone: [phone]   Fax:  [phone]  Physical Therapy Treatment  Patient Details  Name: [name] MRN: [id number] Date of Birth: 1955/10/21 Referring Provider (PT): Ave Filter Date: 11/26/2017  PT End of Session - 11/26/17 1428    Visit Number  4    Number of Visits  9    Date for PT Re-Evaluation  12/07/17    PT Start Time  1351    PT Stop Time  1422    PT Time Calculation (min)  31 min    Activity Tolerance  Patient tolerated treatment well    Behavior During Therapy  [facility] for tasks assessed/performed       Past Medical History:  Diagnosis Date  . ADD (attention deficit disorder)   . Breast cancer ([place]) 2004   rt breast  . Cancer ([place]) 2004   Breast   . Family history of adverse reaction to anesthesia    Sister gets sick as well  . Hepatitis C 09/2012   Genotype 1a, sent to [facility]  . History of radiation therapy 11/21/02- 01/06/03   Right Breast 5,040 cGy in 28 fractions. the tumor bed within the breast area was boosted further to a dose of 6,240 cGy  . History of radiation therapy 07/21/17- 08/28/17   Right Chest wall 50.4 Gy delivered in 1.86 Gy in 28 fractions. Right Chest wall, nodes 50.4 Gy delivered in 1.86 Gy in 28 fractions.   . Hyperlipidemia   . Hypertension   . Personal history of radiation therapy 2004   rt breast  . PONV (postoperative nausea and vomiting)     Past Surgical History:  Procedure Laterality Date  . AUGMENTATION MAMMAPLASTY Bilateral 2007  . BLADDER SUSPENSION    . BREAST LUMPECTOMY Right 2004  . LEEP  1994  . MASTECTOMY Right    2019  . OVARIAN CYST REMOVAL Left 2004  . TOTAL MASTECTOMY Left 10/12/2017   Procedure: LEFT TOTAL MASTECTOMY;  Surgeon: Rolm Bookbinder, MD;  [facility];  Service: General;  Laterality: Left;  . TUBAL LIGATION  2004  . WISDOM TOOTH EXTRACTION      There were no vitals  filed for this visit.  Subjective Assessment - 11/26/17 1354    Subjective  I have been doing my stretches. I have been so busy at work. I can not come twice a week. I did my stretches every other day.     Pertinent History  2004 R breast cancer in situ, completed radiation, 2018 R breast cancer and underwent R mastectomy SLNB in 03/03/17, completed chemo and radiation then underwent L mastectomy prophylactically on 10/12/17    Patient Stated Goals  to get full ROM and decrease tightness    Currently in Pain?  No/denies    Pain Score  0-No pain         [facility] PT Assessment - 11/26/17 0001      AROM   Right Shoulder Flexion  159 Degrees    Right Shoulder ABduction  170 Degrees    Left Shoulder Flexion  161 Degrees    Left Shoulder ABduction  175 Degrees                   [facility] Adult PT Treatment/Exercise - 11/26/17 0001      Shoulder Exercises: Standing   Other Standing Exercises  Continued educating pt in the rest of the Strength ABC  program. Pt did all exercises for 10 reps and used 2 lb weights. Educated pt how to progress reps and resistance at home. She reports being independent with this by end of session. Pt reported knee pain at times so instructed pt in knee strengthening exercises including sit to stands, terminal knee extension in sitting with 3lb ankle weights and hip abduction in sidelying. Educated pt not to do squats in Strength ABC program.                   PT Long Term Goals - 11/26/17 1355      PT LONG TERM GOAL #1   Title  Pt will demonstrate 170 degrees of bilateral shoulder flexion to allow her to reach items overhead.    Baseline  R 147, L 159, 11/26/17- R 155 L 161    Time  4    Period  Weeks    Status  Partially Met      PT LONG TERM GOAL #2   Title  Pt will demonstrate 170 degrees of bilateral shoulder abduction to allow her to reach out to the sides    Baseline  R 137, L 169, 11/26/17- R 170, L 176    Time  4    Period  Weeks     Status  Achieved      PT LONG TERM GOAL #3   Title  Pt will be able to independently verbalize lymphedema risk reduction practices    Time  4    Period  Weeks    Status  Achieved      PT LONG TERM GOAL #4   Title  Pt will be independent in a home exercise program for continued strengthening and stretching    Time  4    Period  Weeks    Status  Achieved      PT LONG TERM GOAL #5   Title  Pt will report a 50% improvement in feelings of tightness across chest with UE movement to allow improved comfort.    Baseline  11/26/17- 90% improved    Time  4    Period  Weeks    Status  Achieved            Plan - 11/26/17 1620    Clinical Impression Statement  Assessed pt's progress towards all goals in therapy. Pt has been doing her stretches at least every other day at home. She reports her range of motion has improved and she is back to her prior level of function. Pt wishes to be discharged today. Instructed pt in remaining Strength ABC program today as well as some knee strengthening exercises since pt reports occasional knee pain. Pt will be discharged at this time.     Rehab Potential  Good    Clinical Impairments Affecting Rehab Potential  hx of radiation    PT Frequency  2x / week    PT Duration  4 weeks    PT Treatment/Interventions  ADLs/Self Care Home Management;Therapeutic exercise;Therapeutic activities;Patient/family education;Manual techniques;Manual lymph drainage;Scar mobilization;Taping;Passive range of motion    PT Next Visit Plan  d/c this visit    PT Home Exercise Plan  supine cane exercises, Supine scap, stretches from Strength ABC packet    Consulted and Agree with Plan of Care  Patient       Patient will benefit from skilled therapeutic intervention in order to improve the following deficits and impairments:  Increased fascial restricitons, Decreased scar mobility, Postural dysfunction, Decreased  range of motion, Decreased strength, Impaired UE functional use,  Increased edema  Visit Diagnosis: Stiffness of right shoulder, not elsewhere classified  Stiffness of left shoulder, not elsewhere classified  Abnormal posture  Muscle weakness (generalized)     Problem List Patient Active Problem List   Diagnosis Date Noted  . 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 Carcinoma of upper-inner quadrant of right breast in female, estrogen receptor positive ([place]) 07/08/2017  . Depression with anxiety 10/02/2014  . ADD (attention deficit disorder) 10/02/2014  . Hepatitis C 09/29/2013  . NASH (nonalcoholic steatohepatitis) 09/29/2013  . Osteopenia 09/29/2013  . Colitis 08/05/2013  . Hyperlipidemia   . Hypertension   . History of breast cancer     Allyson Sabal [facility] 11/26/2017, 4:22 PM  [place], [place], [address] Phone: [phone]   Fax:  [phone]  Name: [name] MRN: [id number] Date of Birth: [birth year]  PHYSICAL THERAPY DISCHARGE SUMMARY  Visits from Start of Care: 4  Current functional level related to goals / functional outcomes: Goals met- see above   Remaining deficits: None   Education / Equipment: HEP  Plan: Patient agrees to discharge.  Patient goals were met. Patient is being discharged due to meeting the stated rehab goals.  ?????        Allyson Sabal [place], [place] 11/26/17 4:23 PM

## 2017-12-01 ENCOUNTER — Ambulatory Visit: Payer: Managed Care, Other (non HMO) | Admitting: Physical Therapy

## 2017-12-03 ENCOUNTER — Ambulatory Visit: Payer: Managed Care, Other (non HMO) | Admitting: Oncology

## 2017-12-03 ENCOUNTER — Ambulatory Visit: Payer: Managed Care, Other (non HMO) | Admitting: Physical Therapy

## 2017-12-08 ENCOUNTER — Encounter: Payer: Managed Care, Other (non HMO) | Admitting: Physical Therapy

## 2017-12-10 ENCOUNTER — Encounter: Payer: Managed Care, Other (non HMO) | Admitting: Physical Therapy

## 2017-12-11 ENCOUNTER — Ambulatory Visit: Payer: Managed Care, Other (non HMO) | Admitting: Oncology

## 2017-12-30 ENCOUNTER — Other Ambulatory Visit: Payer: Self-pay | Admitting: Physician Assistant

## 2018-01-06 ENCOUNTER — Other Ambulatory Visit: Payer: Self-pay

## 2018-01-06 DIAGNOSIS — F419 Anxiety disorder, unspecified: Secondary | ICD-10-CM

## 2018-01-06 MED ORDER — ALPRAZOLAM 0.5 MG PO TABS: ORAL_TABLET | ORAL | 0 refills | Status: DC

## 2018-01-11 ENCOUNTER — Other Ambulatory Visit: Payer: Self-pay

## 2018-01-11 DIAGNOSIS — F419 Anxiety disorder, unspecified: Secondary | ICD-10-CM

## 2018-01-11 MED ORDER — ALPRAZOLAM 0.25 MG PO TABS: ORAL_TABLET | ORAL | 0 refills | Status: DC

## 2018-01-11 NOTE — Telephone Encounter (Signed)
Alternative requested per pharmacy due to 0.53m of Alprazolam being back ordered. Patient called and ask her for an alternative pharmacy. Patient requesting it to go to [facility] on [address].

## 2018-01-13 ENCOUNTER — Other Ambulatory Visit: Payer: Self-pay

## 2018-01-13 DIAGNOSIS — F988 Other specified behavioral and emotional disorders with onset usually occurring in childhood and adolescence: Secondary | ICD-10-CM

## 2018-01-13 MED ORDER — AMPHETAMINE-DEXTROAMPHETAMINE 20 MG PO TABS: ORAL_TABLET | ORAL | 0 refills | Status: DC

## 2018-01-22 ENCOUNTER — Other Ambulatory Visit: Payer: Self-pay | Admitting: Physician Assistant

## 2018-01-22 DIAGNOSIS — G4700 Insomnia, unspecified: Secondary | ICD-10-CM

## 2018-01-25 ENCOUNTER — Other Ambulatory Visit: Payer: Self-pay

## 2018-01-25 MED ORDER — MOMETASONE FUROATE 50 MCG/ACT NA SUSP: 2.0000 | Freq: Every day | NASAL | 3 refills | Status: DC

## 2018-01-27 ENCOUNTER — Other Ambulatory Visit: Payer: Self-pay | Admitting: Internal Medicine

## 2018-01-27 DIAGNOSIS — J3089 Other allergic rhinitis: Secondary | ICD-10-CM

## 2018-01-27 MED ORDER — FLUTICASONE PROPIONATE 50 MCG/ACT NA SUSP: NASAL | 3 refills | Status: AC

## 2018-03-15 ENCOUNTER — Other Ambulatory Visit: Payer: Self-pay

## 2018-03-15 DIAGNOSIS — F988 Other specified behavioral and emotional disorders with onset usually occurring in childhood and adolescence: Secondary | ICD-10-CM

## 2018-03-16 ENCOUNTER — Other Ambulatory Visit: Payer: Self-pay | Admitting: Adult Health

## 2018-03-16 MED ORDER — AMPHETAMINE-DEXTROAMPHETAMINE 20 MG PO TABS: ORAL_TABLET | ORAL | 0 refills | Status: DC

## 2018-04-07 NOTE — Progress Notes (Signed)
Assessment and Plan:  Cholesterol -Continue diet and exercise. Check cholesterol.   Vitamin D Def - continue medications.    Hepatitis C S/p treatment  Breast cancer Continue follow up  Will send labs to [facility] per insurance.   Continue diet and meds as discussed. Further disposition pending results of labs. Over 30 minutes of exam, counseling, chart review, and critical decision making was performed  Future Appointments  Date Time Provider [place]  10/14/2018  9:00 AM Vicie Mutters, PA-C [id number] None     HPI 63 y.o. female  presents for 3 month follow up on hypertension, cholesterol, prediabetes, and vitamin D deficiency.   Being treated at [facility] for recurrent breast cancer, she completed chemo and radiation at [facility] and continue to follow up there.   Her blood pressure has been controlled at home, she is on lisinopril, today their BP is BP: 122/80   She does workout. She denies chest pain, shortness of breath, dizziness.  She has depression and ADD, on wellbutrin 300 mg and adderall will normally take one occ two a day. Prescription normally last 1.5 months.  Will take trazodone for sleep intermittent, she has been doing xanax 0.5m during the day as needed once a day.    She is not on cholesterol medication and denies myalgias. Her cholesterol is not at goal. The cholesterol last visit was:  She started femera last visit which may have increase her cholesterol, she has cut back on dairy and increase her fiber.  Lab Results  Component Value Date   CHOL 251 (H) 10/07/2017   HDL 93 10/07/2017   LDLCALC 141 (H) 10/07/2017   TRIG 77 10/07/2017   CHOLHDL 2.7 10/07/2017   Patient is on Vitamin D supplement.   Lab Results  Component Value Date   VD25OH 65 10/07/2017     BMI is Body mass index is 21.9 kg/m., she is working on diet and exercise. Wt Readings from Last 3 Encounters:  04/09/18 117 lb 12.8 oz (53.4 kg)  10/23/17 119 lb 6 oz (54.1 kg)  10/12/17  118 lb 6.4 oz (53.7 kg)    Current Medications:  Current Outpatient Medications on File Prior to Visit  Medication Sig  . ALPRAZolam (XANAX) 0.25 MG tablet Take 1 to 2 tablet 1 to 2  x /day for severe anxiety only & please avoid daily use/limit use to 5 days /week to avoid addiction  . amphetamine-dextroamphetamine (ADDERALL) 20 MG tablet Take 1/2 to 1 tablet 1 or 2 x/day ONLY if needed for ADD. Limit use to 5 days/week  . Brimonidine Tartrate (LUMIFY) 0.025 % SOLN Place 1 drop into both eyes 2 (two) times daily.  .Marland KitchenbuPROPion (WELLBUTRIN XL) 300 MG 24 hr tablet Take 1 tablet every morning for Mood  . Cholecalciferol (VITAMIN D3) 5000 units CAPS Take 5,000 Units by mouth daily.  . fluticasone (FLONASE) 50 MCG/ACT nasal spray Use 1 to 2 sprays each nares 1 to 2 x /day  . gabapentin (NEURONTIN) 100 MG capsule Take 1 capsule (100 mg total) by mouth 2 (two) times daily.  .Marland Kitchenletrozole (FEMARA) 2.5 MG tablet Take 2.5 mg by mouth daily.  .Marland Kitchenlisinopril-hydrochlorothiazide (PRINZIDE,ZESTORETIC) 20-25 MG tablet TAKE 1 TABLET BY MOUTH EVERY DAY  . MILK THISTLE PO Take 1 tablet by mouth daily.  . Probiotic Product (PROBIOTIC DAILY PO) Take 1 tablet by mouth daily.  . traZODone (DESYREL) 50 MG tablet Take 1 tablet  1 hour before Bedtime   No current facility-administered medications  on file prior to visit.     Medical History:  Past Medical History:  Diagnosis Date  . ADD (attention deficit disorder)   . Breast cancer ([place]) 2004   rt breast  . Cancer ([place]) 2004   Breast   . Family history of adverse reaction to anesthesia    Sister gets sick as well  . Hepatitis C 09/2012   Genotype 1a, sent to [facility]  . History of radiation therapy 11/21/02- 01/06/03   Right Breast 5,040 cGy in 28 fractions. the tumor bed within the breast area was boosted further to a dose of 6,240 cGy  . History of radiation therapy 07/21/17- 08/28/17   Right Chest wall 50.4 Gy delivered in 1.86 Gy in 28 fractions. Right  Chest wall, nodes 50.4 Gy delivered in 1.86 Gy in 28 fractions.   . Hyperlipidemia   . Hypertension   . Personal history of radiation therapy 2004   rt breast  . PONV (postoperative nausea and vomiting)    Allergies: No Known Allergies   Review of Systems:  Review of Systems  Constitutional: Negative.   HENT: Negative.   Eyes: Negative.   Respiratory: Negative.   Cardiovascular: Negative.   Gastrointestinal: Negative.   Genitourinary: Negative.   Musculoskeletal: Negative.   Skin: Negative.     Family history- Review and unchanged Social history- Review and unchanged Physical Exam: BP 122/80   Pulse 91   Temp 98.1 F (36.7 C)   Ht 5' 1.5" (1.562 m)   Wt 117 lb 12.8 oz (53.4 kg)   SpO2 99%   BMI 21.90 kg/m  Wt Readings from Last 3 Encounters:  04/09/18 117 lb 12.8 oz (53.4 kg)  10/23/17 119 lb 6 oz (54.1 kg)  10/12/17 118 lb 6.4 oz (53.7 kg)   General Appearance: Well nourished, in no apparent distress. Eyes: PERRLA, EOMs, conjunctiva no swelling or erythema Sinuses: No Frontal/maxillary tenderness ENT/Mouth: Ext aud canals clear, TMs without erythema, bulging. No erythema, swelling, or exudate on post pharynx.  Tonsils not swollen or erythematous. Hearing normal.  Neck: Supple, thyroid normal.  Respiratory: Respiratory effort normal, BS equal bilaterally without rales, rhonchi, wheezing or stridor.  Cardio: RRR with no MRGs. Brisk peripheral pulses without edema.  Abdomen: Soft, + BS,  Non tender, no guarding, rebound, hernias, masses. Lymphatics: Non tender without lymphadenopathy.  Musculoskeletal: Full ROM, 5/5 strength, Normal gait Skin: Warm, dry without rashes, lesions, ecchymosis.  Neuro: Cranial nerves intact. Normal muscle tone, no cerebellar symptoms. Psych: Awake and oriented X 3, normal affect, Insight and Judgment appropriate.    Vicie Mutters, PA-C 9:14 AM [facility]

## 2018-04-09 ENCOUNTER — Ambulatory Visit (INDEPENDENT_AMBULATORY_CARE_PROVIDER_SITE_OTHER): Payer: PRIVATE HEALTH INSURANCE | Admitting: Physician Assistant

## 2018-04-09 ENCOUNTER — Encounter: Payer: Self-pay | Admitting: Physician Assistant

## 2018-04-09 VITALS — BP 122/80 | HR 91 | Temp 98.1°F | Ht 61.5 in | Wt 117.8 lb

## 2018-04-09 DIAGNOSIS — Z170 Estrogen receptor positive status [ER+]: Secondary | ICD-10-CM

## 2018-04-09 DIAGNOSIS — I10 Essential (primary) hypertension: Secondary | ICD-10-CM

## 2018-04-09 DIAGNOSIS — C50211 Malignant neoplasm of upper-inner quadrant of right female breast: Secondary | ICD-10-CM | POA: Diagnosis not present

## 2018-04-09 DIAGNOSIS — F418 Other specified anxiety disorders: Secondary | ICD-10-CM

## 2018-04-09 DIAGNOSIS — E785 Hyperlipidemia, unspecified: Secondary | ICD-10-CM | POA: Diagnosis not present

## 2018-04-09 DIAGNOSIS — F419 Anxiety disorder, unspecified: Secondary | ICD-10-CM

## 2018-04-09 MED ORDER — ALPRAZOLAM 0.25 MG PO TABS: ORAL_TABLET | ORAL | 0 refills | Status: DC

## 2018-04-09 NOTE — Patient Instructions (Signed)
Cut back on the wellbutrin, do 1/2 a day for 1-2 months and then stop and see how you do  NEW GUIDELINES FOR BENOZOS  New guidelines suggest the benzodiazepines are best short term, with prolonged use they lead to physical and psychological dependence. In addition, evidence suggest that for insomnia the effectiveness wanes in 4 weeks and the risks out weight their benefits. Use of these agents have been associated with dementia, falls, motor vehicle accidents and physical addiction. Decreasing these medication have been proven to show improvements in cognition, alertness, decrease of falls and daytime sedation.   We will start a slow taper, symptoms of withdrawal include, insomnia, anxiety, irritability, sweating and stomach or intestinal symptoms like diarrhea or nausea.

## 2018-05-17 ENCOUNTER — Encounter: Payer: Self-pay | Admitting: Physician Assistant

## 2018-05-24 ENCOUNTER — Other Ambulatory Visit: Payer: Self-pay

## 2018-05-24 DIAGNOSIS — F988 Other specified behavioral and emotional disorders with onset usually occurring in childhood and adolescence: Secondary | ICD-10-CM

## 2018-05-24 MED ORDER — AMPHETAMINE-DEXTROAMPHETAMINE 20 MG PO TABS: ORAL_TABLET | ORAL | 0 refills | Status: DC

## 2018-05-30 ENCOUNTER — Other Ambulatory Visit: Payer: Self-pay | Admitting: Adult Health

## 2018-07-12 ENCOUNTER — Other Ambulatory Visit: Payer: Self-pay

## 2018-07-12 DIAGNOSIS — F419 Anxiety disorder, unspecified: Secondary | ICD-10-CM

## 2018-07-13 MED ORDER — ALPRAZOLAM 0.25 MG PO TABS: ORAL_TABLET | ORAL | 0 refills | Status: DC

## 2018-07-23 ENCOUNTER — Other Ambulatory Visit: Payer: Self-pay | Admitting: Physician Assistant

## 2018-07-26 ENCOUNTER — Other Ambulatory Visit: Payer: Self-pay

## 2018-07-26 DIAGNOSIS — F988 Other specified behavioral and emotional disorders with onset usually occurring in childhood and adolescence: Secondary | ICD-10-CM

## 2018-07-27 ENCOUNTER — Other Ambulatory Visit: Payer: Self-pay

## 2018-07-27 DIAGNOSIS — F988 Other specified behavioral and emotional disorders with onset usually occurring in childhood and adolescence: Secondary | ICD-10-CM

## 2018-07-27 MED ORDER — AMPHETAMINE-DEXTROAMPHETAMINE 20 MG PO TABS: ORAL_TABLET | ORAL | 0 refills | Status: DC

## 2018-08-23 ENCOUNTER — Encounter: Payer: Self-pay | Admitting: Physician Assistant

## 2018-08-23 DIAGNOSIS — C787 Secondary malignant neoplasm of liver and intrahepatic bile duct: Secondary | ICD-10-CM | POA: Insufficient documentation

## 2018-09-20 ENCOUNTER — Other Ambulatory Visit: Payer: Self-pay | Admitting: Physician Assistant

## 2018-10-14 ENCOUNTER — Encounter: Payer: Self-pay | Admitting: Physician Assistant

## 2018-10-18 ENCOUNTER — Other Ambulatory Visit: Payer: Self-pay

## 2018-10-18 DIAGNOSIS — F988 Other specified behavioral and emotional disorders with onset usually occurring in childhood and adolescence: Secondary | ICD-10-CM

## 2018-10-18 MED ORDER — AMPHETAMINE-DEXTROAMPHETAMINE 20 MG PO TABS: ORAL_TABLET | ORAL | 0 refills | Status: DC

## 2018-10-25 NOTE — Progress Notes (Signed)
Complete Physical  LABS DONE AT [facility] DUE TO INSURANCE Assessment and Plan: Essential hypertension -  DASH diet, exercise and monitor at home. Call if greater than 130/80.  - EKG 12-Lead  NASH (nonalcoholic steatohepatitis) Check labs, avoid tylenol, alcohol, weight loss advised.   Hepatitis C virus infection without hepatic coma, unspecified chronicity S/p treatment   Hyperlipidemia -continue medications, check lipids, decrease fatty foods, increase activity. WILL GET ADVANCED LIPID PANEL  Colitis No symptoms  Osteoporosis Get DEXA 2 years GOING TO GET SHOTS - ON [place]  Breast Cancer history GETTING MASTECTOMY ON FEMERA, DONE WITH TREATMENTS- being followed  Depression with anxiety Continue wellbutrin   ADD (attention deficit disorder) Continue medication  Encounter for general adult medical examination with abnormal findings - PAP THIS YEAR  Medication management  Vitamin D deficiency Continue supplement  Elevated ferritin -     Iron,Total/Total Iron Binding Cap  Insomnia, unspecified type - discussed getting off benzo with patient  Chronic anxiety -     ALPRAZolam (XANAX) 0.5 MG tablet; Take 1/2 to 1 tablet 1 to 2 x/ day only if needed for Anxiety attack  Screening cervical cancer Pap sent off  Discussed med's effects and SE's. Screening labs and tests as requested with regular follow-up as recommended.  HPI 63 y.o. female  presents for a complete physical.  Her blood pressure has not been controlled at home,  today he BP is BP: 116/88 She does workout. She denies chest pain, shortness of breath, dizziness.   Sadly, she has had recurrent breast cancer, now having with mets to her liver. Has had 1 treatment of chemotherapy, has right portacath in place, and going to being do courses of 4 and then 12 courses. Following with Dr. Humphrey Rolls. s/p  total left mastectomy 10/2017, s/p chemo and radiation.    New grandson Fin 8 years old and Orene Desanctis is  granddaughter, 3 in Oct.   She continue to have right upper chest/shoulder pain, has increased her gabapentin to 2 pills twice a day which helps. No rash, no pain with exertion. Marland Kitchen   BMI is Body mass index is 22.47 kg/m., she is working on diet and exercise. Wt Readings from Last 3 Encounters:  10/28/18 117 lb (53.1 kg)  04/09/18 117 lb 12.8 oz (53.4 kg)  10/23/17 119 lb 6 oz (54.1 kg)   She is not on cholesterol medication and denies myalgias. Her cholesterol is not at goal. The cholesterol last visit was:   Lab Results  Component Value Date   CHOL 251 (H) 10/07/2017   HDL 93 10/07/2017   LDLCALC 141 (H) 10/07/2017   TRIG 77 10/07/2017   CHOLHDL 2.7 10/07/2017   Lab Results  Component Value Date   HGBA1C 5.3 09/29/2013   Patient is on Vitamin D supplement, on 5000 IU daily.    Lab Results  Component Value Date   VD25OH 61 10/07/2017     She has a history of hepatitis C, has completed treatment with harvoni. And she has a history of NASH via CT AB in 2015.  Lab Results  Component Value Date   ALT 12 10/07/2017   AST 16 10/07/2017   ALKPHOS 98 04/09/2016   BILITOT 0.5 10/07/2017   Patient is on an ADD medication, she states that the medication is helping and she denies any adverse reactions.   She is off wellbutrin and doing well.    Current Medications:  Current Outpatient Medications on File Prior to Visit  Medication Sig Dispense  Refill  . ALPRAZolam (XANAX) 0.25 MG tablet Take 1  tablet 1 to 2  x /day for severe anxiety only & please avoid daily use/limit use to 5 days /week to avoid addiction 90 tablet 0  . amphetamine-dextroamphetamine (ADDERALL) 20 MG tablet Take 1/2 to 1 tablet 1 or 2 x/day ONLY if needed for ADD, should last longer than a month 60 tablet 0  . Brimonidine Tartrate (LUMIFY) 0.025 % SOLN Place 1 drop into both eyes 2 (two) times daily.    Marland Kitchen buPROPion (WELLBUTRIN XL) 300 MG 24 hr tablet TAKE 1 TABLET EVERY MORNING FOR MOOD 90 tablet 1  .  Cholecalciferol (VITAMIN D3) 5000 units CAPS Take 5,000 Units by mouth daily.    . fluticasone (FLONASE) 50 MCG/ACT nasal spray Use 1 to 2 sprays each nares 1 to 2 x /day 48 g 3  . gabapentin (NEURONTIN) 100 MG capsule TAKE 1 CAPSULE BY MOUTH TWICE A DAY 180 capsule 1  . letrozole (FEMARA) 2.5 MG tablet Take 2.5 mg by mouth daily.  3  . lisinopril-hydrochlorothiazide (ZESTORETIC) 20-25 MG tablet Take 1 tablet Daily for BP  & Fluid 90 tablet 1  . MILK THISTLE PO Take 1 tablet by mouth daily.    . Probiotic Product (PROBIOTIC DAILY PO) Take 1 tablet by mouth daily.    . traZODone (DESYREL) 50 MG tablet Take 1 tablet  1 hour before Bedtime 90 tablet 1   No current facility-administered medications on file prior to visit.    Health Maintenance:   Immunization History  Administered Date(s) Administered  . Influenza-Unspecified 10/20/2017  . Tdap 09/19/2008   Tetanus: 2010 due, will wait Pneumovax: N/A Prenvar 13: N/A Flu vaccine: 2019 at work Zostavax: pending Hep A&B: done  Pap: 2019 [facility] 09/2017 DEXA: 07/2016 osteoporosis, was on fosamax, switched to [facility]- need follow up Colonoscopy: 09/25/2013- with Dr. Alice Reichert  EGD: CTAB 2015: NASH and Colitis Echo 2011: normal EF 60-65%  Eye: Dr. Gershon Crane Dentist: Dr. Benito Mccreedy  Medical History:  Past Medical History:  Diagnosis Date  . ADD (attention deficit disorder)   . Breast cancer ([place]) 2004   rt breast  . Cancer ([place]) 2004   Breast   . Family history of adverse reaction to anesthesia    Sister gets sick as well  . Hepatitis C 09/2012   Genotype 1a, sent to [facility]  . History of radiation therapy 11/21/02- 01/06/03   Right Breast 5,040 cGy in 28 fractions. the tumor bed within the breast area was boosted further to a dose of 6,240 cGy  . History of radiation therapy 07/21/17- 08/28/17   Right Chest wall 50.4 Gy delivered in 1.86 Gy in 28 fractions. Right Chest wall, nodes 50.4 Gy delivered in 1.86 Gy in 28 fractions.   .  Hyperlipidemia   . Hypertension   . Personal history of radiation therapy 2004   rt breast  . PONV (postoperative nausea and vomiting)    Allergies No Known Allergies  SURGICAL HISTORY She  has a past surgical history that includes LEEP (1994); Tubal ligation (2004); Ovarian cyst removal (Left, 2004); Augmentation mammaplasty (Bilateral, 2007); Breast lumpectomy (Right, 2004); Mastectomy (Right); Bladder suspension; Wisdom tooth extraction; and Total mastectomy (Left, 10/12/2017). FAMILY HISTORY Her family history includes Cancer in her mother and sister; Hypertension in her father; Stroke in her father. SOCIAL HISTORY She  reports that she quit smoking about 30 years ago. Her smoking use included cigarettes. She has never used smokeless tobacco. She reports current  alcohol use. She reports that she does not use drugs.   Review of Systems  Constitutional: Negative.   HENT: Negative.   Eyes: Negative.   Respiratory: Negative.   Cardiovascular: Negative.   Gastrointestinal: Negative.   Genitourinary: Negative.   Musculoskeletal: Negative.   Skin: Negative.   Neurological: Negative.   Endo/Heme/Allergies: Negative.   Psychiatric/Behavioral: Negative.    Physical Exam: Estimated body mass index is 22.47 kg/m as calculated from the following:   Height as of this encounter: 5' 0.5" (1.537 m).   Weight as of this encounter: 117 lb (53.1 kg). BP 116/88   Pulse 100   Temp 97.6 F (36.4 C)   Ht 5' 0.5" (1.537 m)   Wt 117 lb (53.1 kg)   SpO2 99%   BMI 22.47 kg/m  General Appearance: Well nourished, in no apparent distress. Eyes: PERRLA, EOMs, conjunctiva no swelling or erythema, normal fundi and vessels. Sinuses: No Frontal/maxillary tenderness ENT/Mouth: Ext aud canals clear, normal light reflex with TMs without erythema, bulging.  Good dentition. No erythema, swelling, or exudate on post pharynx. Tonsils not swollen or erythematous. Hearing normal.  Neck: Supple, thyroid normal.  No bruits Respiratory: Respiratory effort normal, BS equal bilaterally without rales, rhonchi, wheezing or stridor. Cardio: RRR without murmurs, rubs or gallops. Brisk peripheral pulses without edema.  Chest: symmetric, with normal excursions and percussion. Breasts: defer Abdomen: Soft, +BS. Non tender, no guarding, rebound, hernias, masses, or organomegaly.  Lymphatics: Non tender without lymphadenopathy.  GYN: normal external genitalia, vulva, vagina, cervix, uterus and adnexa, VAGINA: atrophic. Musculoskeletal: Full ROM all peripheral extremities,5/5 strength, and normal gait. Skin: Warm, dry without rashes, lesions, ecchymosis.  Neuro: Cranial nerves intact, reflexes equal bilaterally. Normal muscle tone, no cerebellar symptoms. Sensation intact.  Psych: Awake and oriented X 3, normal affect, Insight and Judgment appropriate.    Vicie Mutters 9:47 AM

## 2018-10-28 ENCOUNTER — Encounter: Payer: Self-pay | Admitting: Physician Assistant

## 2018-10-28 ENCOUNTER — Other Ambulatory Visit: Payer: Self-pay

## 2018-10-28 ENCOUNTER — Ambulatory Visit (INDEPENDENT_AMBULATORY_CARE_PROVIDER_SITE_OTHER): Payer: PRIVATE HEALTH INSURANCE | Admitting: Physician Assistant

## 2018-10-28 VITALS — BP 116/88 | HR 100 | Temp 97.6°F | Ht 60.5 in | Wt 117.0 lb

## 2018-10-28 DIAGNOSIS — Z853 Personal history of malignant neoplasm of breast: Secondary | ICD-10-CM

## 2018-10-28 DIAGNOSIS — Z0000 Encounter for general adult medical examination without abnormal findings: Secondary | ICD-10-CM | POA: Diagnosis not present

## 2018-10-28 DIAGNOSIS — E559 Vitamin D deficiency, unspecified: Secondary | ICD-10-CM

## 2018-10-28 DIAGNOSIS — M8580 Other specified disorders of bone density and structure, unspecified site: Secondary | ICD-10-CM

## 2018-10-28 DIAGNOSIS — F419 Anxiety disorder, unspecified: Secondary | ICD-10-CM

## 2018-10-28 DIAGNOSIS — E785 Hyperlipidemia, unspecified: Secondary | ICD-10-CM

## 2018-10-28 DIAGNOSIS — C787 Secondary malignant neoplasm of liver and intrahepatic bile duct: Secondary | ICD-10-CM

## 2018-10-28 DIAGNOSIS — I10 Essential (primary) hypertension: Secondary | ICD-10-CM

## 2018-10-28 DIAGNOSIS — Z170 Estrogen receptor positive status [ER+]: Secondary | ICD-10-CM

## 2018-10-28 DIAGNOSIS — Z0001 Encounter for general adult medical examination with abnormal findings: Secondary | ICD-10-CM

## 2018-10-28 DIAGNOSIS — K7581 Nonalcoholic steatohepatitis (NASH): Secondary | ICD-10-CM

## 2018-10-28 DIAGNOSIS — C50211 Malignant neoplasm of upper-inner quadrant of right female breast: Secondary | ICD-10-CM

## 2018-10-28 DIAGNOSIS — Z130 Encounter for screening for diseases of the blood and blood-forming organs and certain disorders involving the immune mechanism: Secondary | ICD-10-CM

## 2018-10-28 DIAGNOSIS — B1920 Unspecified viral hepatitis C without hepatic coma: Secondary | ICD-10-CM

## 2018-10-28 DIAGNOSIS — F988 Other specified behavioral and emotional disorders with onset usually occurring in childhood and adolescence: Secondary | ICD-10-CM

## 2018-10-28 DIAGNOSIS — Z1589 Genetic susceptibility to other disease: Secondary | ICD-10-CM

## 2018-10-28 DIAGNOSIS — Z79899 Other long term (current) drug therapy: Secondary | ICD-10-CM

## 2018-10-28 DIAGNOSIS — R52 Pain, unspecified: Secondary | ICD-10-CM

## 2018-10-28 DIAGNOSIS — Z1501 Genetic susceptibility to malignant neoplasm of breast: Secondary | ICD-10-CM

## 2018-10-28 DIAGNOSIS — F418 Other specified anxiety disorders: Secondary | ICD-10-CM

## 2018-10-28 MED ORDER — ALPRAZOLAM 0.5 MG PO TABS: ORAL_TABLET | ORAL | 1 refills | Status: DC

## 2018-10-28 MED ORDER — GABAPENTIN 300 MG PO CAPS: 300.0000 mg | ORAL_CAPSULE | Freq: Two times a day (BID) | ORAL | 2 refills | Status: DC

## 2018-10-28 NOTE — Patient Instructions (Signed)
Know what a healthy weight is for you (roughly BMI <25) and aim to maintain this  Aim for 7+ servings of fruits and vegetables daily  65-80+ fluid ounces of water or unsweet tea for healthy kidneys  Limit to max 1 drink of alcohol per day; avoid smoking/tobacco  Limit animal fats in diet for cholesterol and heart health - choose grass fed whenever available  Avoid highly processed foods, and foods high in saturated/trans fats  Aim for low stress - take time to unwind and care for your mental health  Aim for 150 min of moderate intensity exercise weekly for heart health, and weights twice weekly for bone health  Aim for 7-9 hours of sleep daily

## 2018-12-16 ENCOUNTER — Other Ambulatory Visit: Payer: Self-pay | Admitting: Internal Medicine

## 2018-12-17 ENCOUNTER — Other Ambulatory Visit: Payer: Self-pay

## 2018-12-17 DIAGNOSIS — F988 Other specified behavioral and emotional disorders with onset usually occurring in childhood and adolescence: Secondary | ICD-10-CM

## 2018-12-17 MED ORDER — AMPHETAMINE-DEXTROAMPHETAMINE 20 MG PO TABS: ORAL_TABLET | ORAL | 0 refills | Status: DC

## 2019-01-21 ENCOUNTER — Other Ambulatory Visit: Payer: Self-pay

## 2019-01-21 ENCOUNTER — Other Ambulatory Visit: Payer: Self-pay | Admitting: Adult Health

## 2019-01-21 MED ORDER — BUPROPION HCL ER (XL) 300 MG PO TB24: 300.0000 mg | ORAL_TABLET | Freq: Every day | ORAL | 1 refills | Status: DC

## 2019-02-13 ENCOUNTER — Other Ambulatory Visit: Payer: Self-pay

## 2019-02-13 DIAGNOSIS — F988 Other specified behavioral and emotional disorders with onset usually occurring in childhood and adolescence: Secondary | ICD-10-CM

## 2019-02-14 MED ORDER — AMPHETAMINE-DEXTROAMPHETAMINE 20 MG PO TABS: ORAL_TABLET | ORAL | 0 refills | Status: DC

## 2019-02-22 ENCOUNTER — Other Ambulatory Visit: Payer: Self-pay | Admitting: Physician Assistant

## 2019-02-22 DIAGNOSIS — F419 Anxiety disorder, unspecified: Secondary | ICD-10-CM

## 2019-03-23 ENCOUNTER — Other Ambulatory Visit: Payer: Self-pay | Admitting: Oncology

## 2019-03-23 DIAGNOSIS — C50919 Malignant neoplasm of unspecified site of unspecified female breast: Secondary | ICD-10-CM

## 2019-04-13 ENCOUNTER — Encounter: Payer: Self-pay | Admitting: *Deleted

## 2019-04-13 ENCOUNTER — Other Ambulatory Visit: Payer: Self-pay

## 2019-04-13 ENCOUNTER — Ambulatory Visit
Admission: RE | Admit: 2019-04-13 | Discharge: 2019-04-13 | Disposition: A | Discharge status: Home / Self Care | Payer: Managed Care, Other (non HMO) | Source: Ambulatory Visit | Attending: Oncology | Admitting: Oncology

## 2019-04-13 DIAGNOSIS — C50919 Malignant neoplasm of unspecified site of unspecified female breast: Secondary | ICD-10-CM

## 2019-04-13 HISTORY — PX: IR RADIOLOGIST EVAL & MGMT: IMG5224

## 2019-04-13 NOTE — Progress Notes (Signed)
Chief Complaint: Patient was consulted remotely today (TeleHealth) for hepatic ablation.  Referring Physician(s): Khan,Kalsoom  History of Present Illness: [name] is a 64 y.o. female referred previously by Dr. Humphrey Rolls for ablation of metastatic disease to the liver from breast carcinoma.  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  There were 2 other documented small right lobe lesions at that time.  On 09/28/2018, the patient was placed under general anesthesia with plans to ablate the dominant hepatic metastasis.  However, imaging demonstrated significant enlargement of the metastatic mass at the dome of the liver measuring approximately 4.3 x 3.2 x 2.5 cm with additional more prominent metastatic lesions in the right and left lobes.  Thermal ablation was therefore canceled at that time and the patient was placed on additional chemotherapy in September of last year which was recently completed.  Imaging during treatment demonstrated reduction in metastatic disease with the most recent MRI on 04/07/2019 demonstrating the superior right lobe lesion now measuring approximately 2.4 x 1.9 x 1.5 cm.  Other lesions are no longer readily apparent by MRI. [name] is feeling well and has no complaints today.  Past Medical History:  Diagnosis Date  . ADD (attention deficit disorder)   . Breast cancer ([place]) 2004   rt breast  . Cancer ([place]) 2004   Breast   . Family history of adverse reaction to anesthesia    Sister gets sick as well  . Hepatitis C 09/2012   Genotype 1a, sent to [facility]  . History of radiation therapy 11/21/02- 01/06/03   Right Breast 5,040 cGy in 28 fractions. the tumor bed within the breast area was boosted further to a dose of 6,240 cGy  . History of radiation therapy 07/21/17- 08/28/17   Right Chest  wall 50.4 Gy delivered in 1.86 Gy in 28 fractions. Right Chest wall, nodes 50.4 Gy delivered in 1.86 Gy in 28 fractions.   . Hyperlipidemia   . Hypertension   . Personal history of radiation therapy 2004   rt breast  . PONV (postoperative nausea and vomiting)     Past Surgical History:  Procedure Laterality Date  . AUGMENTATION MAMMAPLASTY Bilateral 2007  . BLADDER SUSPENSION    . BREAST LUMPECTOMY Right 2004  . IR RADIOLOGIST EVAL & MGMT  04/13/2019  . LEEP  1994  . MASTECTOMY Right    2019  . OVARIAN CYST REMOVAL Left 2004  . TOTAL MASTECTOMY Left 10/12/2017   Procedure: LEFT TOTAL MASTECTOMY;  Surgeon: Rolm Bookbinder, MD;  Location: [facility];  Service: General;  Laterality: Left;  . TUBAL LIGATION  2004  . WISDOM TOOTH EXTRACTION      Allergies: Patient has no known allergies.  Medications: Prior to Admission medications   Medication Sig Start Date End Date Taking? Authorizing Provider  ALPRAZolam (XANAX) 0.5 MG tablet TAKE 1 TABLET 1 TO 2 TIMES DAILY FOR SEVERE ANXIETY ONLY, UP TO 5 DAYS WEEKLY 02/23/19   Vicie Mutters, PA-C  amphetamine-dextroamphetamine (ADDERALL) 20 MG tablet Take 1/2 to 1 tablet 1 or 2 x/day ONLY if needed for ADD, should last longer than a month 02/14/19   Vicie Mutters, PA-C  Brimonidine Tartrate (LUMIFY) 0.025 % SOLN Place 1 drop into both eyes 2 (two) times daily.    [provider]  buPROPion [facility]  XL) 300 MG 24 hr tablet Take 1 tablet Daily for Mood , Focus & Concentration 01/21/19   [name], MD  Cholecalciferol (VITAMIN D3) 5000 units CAPS Take 5,000 Units by mouth daily.    [provider]  fluticasone Asencion Islam) 50 MCG/ACT nasal spray Use 1 to 2 sprays each nares 1 to 2 x /day 01/27/18   [name], MD  gabapentin (NEURONTIN) 300 MG capsule Take 1 capsule (300 mg total) by mouth 2 (two) times daily. 10/28/18   Vicie Mutters, PA-C  lisinopril-hydrochlorothiazide (ZESTORETIC) 20-25 MG tablet TAKE 1 TABLET  DAILY FOR BP & FLUID 12/16/18   [name], MD  MILK THISTLE PO Take 1 tablet by mouth daily.    [provider]  Probiotic Product (PROBIOTIC DAILY PO) Take 1 tablet by mouth daily.    [provider]  traZODone (DESYREL) 50 MG tablet Take 1 tablet  1 hour before Bedtime 01/23/18   [name], MD     Family History  Problem Relation Age of Onset  . Cancer Mother   . Stroke Father   . Hypertension Father   . Cancer Sister     Social History   Socioeconomic History  . Marital status: Married    Spouse name: Not on file  . Number of children: Not on file  . Years of education: Not on file  . Highest education level: Not on file  Occupational History  . Not on file  Tobacco Use  . Smoking status: Former Smoker    Types: Cigarettes    Quit date: 02/04/1988    Years since quitting: 31.2  . Smokeless tobacco: Never Used  Substance and Sexual Activity  . Alcohol use: Yes    Comment: rarely  . Drug use: No  . Sexual activity: Not on file  Other Topics Concern  . Not on file  Social History Narrative  . Not on file   Social Determinants of Health   Financial Resource Strain:   . Difficulty of Paying Living Expenses: Not on file  Food Insecurity:   . Worried About Charity fundraiser in the Last Year: Not on file  . Ran Out of Food in the Last Year: Not on file  Transportation Needs:   . Lack of Transportation (Medical): Not on file  . Lack of Transportation (Non-Medical): Not on file  Physical Activity:   . Days of Exercise per Week: Not on file  . Minutes of Exercise per Session: Not on file  Stress:   . Feeling of Stress : Not on file  Social Connections:   . Frequency of Communication with Friends and Family: Not on file  . Frequency of Social Gatherings with Friends and Family: Not on file  . Attends Religious Services: Not on file  . Active Member of Clubs or Organizations: Not on file  . Attends Archivist Meetings: Not on  file  . Marital Status: Not on file    ECOG Status: 0 - Asymptomatic  Review of Systems  Constitutional: Negative.   Respiratory: Negative.   Cardiovascular: Negative.   Gastrointestinal: Negative.   Genitourinary: Negative.   Musculoskeletal: Negative.   Neurological: Negative.     Review of Systems: A 12 point ROS discussed and pertinent positives are indicated in the HPI above.  All other systems are negative.  Physical Exam No direct physical exam was performed (except for noted visual exam findings with Video Visits).   Vital Signs: There were no vitals taken  for this visit.  Imaging: IR Radiologist Eval & Mgmt  Result Date: 04/13/2019 Please refer to notes tab for details about interventional procedure. (Op Note)   Labs:  CBC: No results for input(s): WBC, HGB, HCT, PLT in the last 8760 hours.  COAGS: No results for input(s): INR, APTT in the last 8760 hours.  BMP: No results for input(s): NA, K, CL, CO2, GLUCOSE, BUN, CALCIUM, CREATININE, GFRNONAA, GFRAA in the last 8760 hours.  Invalid input(s): CMP  LIVER FUNCTION TESTS: No results for input(s): BILITOT, AST, ALT, ALKPHOS, PROT, ALBUMIN in the last 8760 hours.  TUMOR MARKERS: No results for input(s): AFPTM, CEA, CA199, CHROMGRNA in the last 8760 hours.  Assessment and Plan:  I reviewed follow-up imaging findings with [name] by audio/video conference.  The liver has shown excellent response to additional chemotherapy with now only the dominant lesion at the dome definitely visible by MRI and demonstrating significant decrease in size since last August and measuring roughly 2.5 cm.    I recommended that we now proceed with percutaneous thermal ablation of the lesion under general anesthesia.  We again reviewed the fact that the lesion is subcapsular and near the diaphragm which may increase the risk of diaphragmatic irritation with corresponding pain and risk of developing a sympathetic pleural effusion  after the procedure.  She is aware of the risks and would like to proceed with ablation.  She will be admitted for overnight observation following the procedure.  We will begin the scheduling process [facility] to perform a microwave thermal ablation procedure under CT guidance and general anesthesia.   Electronically Signed: Azzie Roup 04/13/2019, 10:17 AM   I spent a total of  15 Minutes in remote  clinical consultation, greater than 50% of which was counseling/coordinating care for ablation of a liver metastasis.    Visit type: Audio and video [employer]).   Alternative for in-person consultation at [facility], [address] [place], [place], [place]. This visit type was conducted due to national recommendations for restrictions regarding the COVID-19 Pandemic (e.g. social distancing).  This format is felt to be most appropriate for this patient at this time.  All issues noted in this document were discussed and addressed.

## 2019-04-27 ENCOUNTER — Other Ambulatory Visit: Payer: Self-pay

## 2019-04-27 DIAGNOSIS — F988 Other specified behavioral and emotional disorders with onset usually occurring in childhood and adolescence: Secondary | ICD-10-CM

## 2019-04-28 MED ORDER — AMPHETAMINE-DEXTROAMPHETAMINE 20 MG PO TABS: ORAL_TABLET | ORAL | 0 refills | Status: DC

## 2019-05-06 ENCOUNTER — Other Ambulatory Visit: Payer: Self-pay | Admitting: Physician Assistant

## 2019-05-06 DIAGNOSIS — C787 Secondary malignant neoplasm of liver and intrahepatic bile duct: Secondary | ICD-10-CM

## 2019-06-01 ENCOUNTER — Ambulatory Visit
Admission: RE | Admit: 2019-06-01 | Discharge: 2019-06-01 | Disposition: A | Discharge status: Home / Self Care | Payer: PRIVATE HEALTH INSURANCE | Source: Ambulatory Visit | Attending: Physician Assistant | Admitting: Physician Assistant

## 2019-06-01 ENCOUNTER — Encounter: Payer: Self-pay | Admitting: *Deleted

## 2019-06-01 ENCOUNTER — Other Ambulatory Visit: Payer: Self-pay

## 2019-06-01 DIAGNOSIS — C787 Secondary malignant neoplasm of liver and intrahepatic bile duct: Secondary | ICD-10-CM

## 2019-06-01 HISTORY — PX: IR RADIOLOGIST EVAL & MGMT: IMG5224

## 2019-06-01 NOTE — Progress Notes (Signed)
Chief Complaint: Patient was consulted remotely today (TeleHealth) for follow up after liver ablation at the request of Ardis Rowan.    Referring Physician(s): Ardis Rowan  History of Present Illness: [name] is a 64 y.o. 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  After overnight observation she did have some persistent discomfort primarily in the right shoulder and had a follow-up CTA of the chest with CT of the abdomen and pelvis on 05/11/2019 through the Emergency Department.  This demonstrated no acute findings, a well-circumscribed low-density ablation defect at the dome of the liver and nodular heterogeneous enhancement pattern throughout the rest of the liver parenchyma.  She did have some abdominal pain following that CT scan but this has since completely resolved and she currently is asymptomatic and feels back to her baseline.  She has now added Verzenio treatment for the past week per Dr. Chancy Milroy and is scheduled to follow-up with Dr. Chancy Milroy in late May.  Past Medical History:  Diagnosis Date  . ADD (attention deficit disorder)   . Breast cancer ([place]) 2004   rt breast  . Cancer ([place]) 2004   Breast   . Family history of adverse reaction to anesthesia    Sister gets sick as well  . Hepatitis C 09/2012   Genotype 1a, sent to [facility]  . History of radiation therapy 11/21/02- 01/06/03   Right Breast 5,040 cGy in 28 fractions. the tumor bed within the breast area was boosted further to a dose of 6,240 cGy  . History of radiation therapy 07/21/17- 08/28/17   Right Chest wall 50.4 Gy delivered in 1.86 Gy in 28 fractions. Right Chest wall, nodes 50.4 Gy delivered in 1.86 Gy in 28 fractions.   . Hyperlipidemia   . Hypertension   . Personal history of radiation therapy 2004   rt breast   . PONV (postoperative nausea and vomiting)     Past Surgical History:  Procedure Laterality Date  . AUGMENTATION MAMMAPLASTY Bilateral 2007  . BLADDER SUSPENSION    . BREAST LUMPECTOMY Right 2004  . IR RADIOLOGIST EVAL & MGMT  04/13/2019  . LEEP  1994  . MASTECTOMY Right    2019  . OVARIAN CYST REMOVAL Left 2004  . TOTAL MASTECTOMY Left 10/12/2017   Procedure: LEFT TOTAL MASTECTOMY;  Surgeon: Rolm Bookbinder, MD;  Location: [facility];  Service: General;  Laterality: Left;  . TUBAL LIGATION  2004  . WISDOM TOOTH EXTRACTION      Allergies: Patient has no known allergies.  Medications: Prior to Admission medications   Medication Sig Start Date End Date Taking? Authorizing Provider  ALPRAZolam (XANAX) 0.5 MG tablet TAKE 1 TABLET 1 TO 2 TIMES DAILY FOR SEVERE ANXIETY ONLY, UP TO 5 DAYS WEEKLY 02/23/19   Vicie Mutters, PA-C  amphetamine-dextroamphetamine (ADDERALL) 20 MG tablet Take 1/2 to 1 tablet 1 or 2 x/day ONLY if needed for ADD, should last longer than a month 04/28/19   Vicie Mutters, PA-C  Brimonidine Tartrate (LUMIFY) 0.025 % SOLN Place 1 drop into both eyes 2 (two) times daily.    [provider]  buPROPion (WELLBUTRIN XL) 300 MG 24 hr tablet Take 1 tablet Daily for Mood , Focus & Concentration 01/21/19   [name], MD  Cholecalciferol (VITAMIN D3) 5000 units CAPS Take 5,000 Units by  mouth daily.    [provider]  fluticasone Asencion Islam) 50 MCG/ACT nasal spray Use 1 to 2 sprays each nares 1 to 2 x /day 01/27/18   [name], MD  gabapentin (NEURONTIN) 300 MG capsule Take 1 capsule (300 mg total) by mouth 2 (two) times daily. 10/28/18   Vicie Mutters, PA-C  lisinopril-hydrochlorothiazide (ZESTORETIC) 20-25 MG tablet TAKE 1 TABLET DAILY FOR BP & FLUID 12/16/18   [name], MD  MILK THISTLE PO Take 1 tablet by mouth daily.    [provider]  Probiotic Product (PROBIOTIC DAILY PO) Take 1 tablet by mouth daily.    [provider]  traZODone (DESYREL) 50 MG tablet Take 1 tablet  1 hour before Bedtime 01/23/18   [name], MD     Family History  Problem Relation Age of Onset  . Cancer Mother   . Stroke Father   . Hypertension Father   . Cancer Sister     Social History   Socioeconomic History  . Marital status: Married    Spouse name: Not on file  . Number of children: Not on file  . Years of education: Not on file  . Highest education level: Not on file  Occupational History  . Not on file  Tobacco Use  . Smoking status: Former Smoker    Types: Cigarettes    Quit date: 02/04/1988    Years since quitting: 31.3  . Smokeless tobacco: Never Used  Substance and Sexual Activity  . Alcohol use: Yes    Comment: rarely  . Drug use: No  . Sexual activity: Not on file  Other Topics Concern  . Not on file  Social History Narrative  . Not on file   Social Determinants of Health   Financial Resource Strain:   . Difficulty of Paying Living Expenses:   Food Insecurity:   . Worried About Charity fundraiser in the Last Year:   . Arboriculturist in the Last Year:   Transportation Needs:   . Film/video editor (Medical):   Marland Kitchen Lack of Transportation (Non-Medical):   Physical Activity:   . Days of Exercise per Week:   . Minutes of Exercise per Session:   Stress:   . Feeling of Stress :   Social Connections:   . Frequency of Communication with Friends and Family:   . Frequency of Social Gatherings with Friends and Family:   . Attends Religious Services:   . Active Member of Clubs or Organizations:   . Attends Archivist Meetings:   Marland Kitchen Marital Status:     ECOG Status: 0 - Asymptomatic  Review of Systems  Constitutional: Negative.   Respiratory: Negative.   Cardiovascular: Negative.   Gastrointestinal: Negative.   Genitourinary: Negative.   Musculoskeletal: Negative.   Neurological: Negative.     Review of Systems: A 12 point ROS discussed and pertinent positives are  indicated in the HPI above.  All other systems are negative.  Physical Exam No direct physical exam was performed (except for noted visual exam findings with Video Visits).    Vital Signs: There were no vitals taken for this visit.  Imaging: No results found.  Labs:  CBC: No results for input(s): WBC, HGB, HCT, PLT in the last 8760 hours.  COAGS: No results for input(s): INR, APTT in the last 8760 hours.  BMP: No results for input(s): NA, K, CL, CO2, GLUCOSE, BUN, CALCIUM, CREATININE, GFRNONAA, GFRAA in the last 8760 hours.  Invalid input(s): CMP  LIVER FUNCTION TESTS: No results for input(s): BILITOT, AST, ALT, ALKPHOS, PROT, ALBUMIN in the last 8760 hours.  TUMOR MARKERS: No results for input(s): AFPTM, CEA, CA199, CHROMGRNA in the last 8760 hours.  Assessment and Plan:  Mrs. Paddock is doing well after percutaneous thermal ablation of a metastatic lesion at the dome of the right lobe of the liver on 05/05/2019. She has no evidence of complication following the procedure.  There is some question on a follow-up CT performed through the Emergency Department 1 week after the procedure for pain whether there may be other metastatic lesions in the liver.  The liver does appear very heterogeneous by CT and also appeared very heterogeneous by a imaging during the ablation procedure.  I have recommended that she undergo a follow-up MRI in May prior to her next follow-up appointment with Dr. Chancy Milroy. Dr. Chancy Milroy has also now started [facility] in addition to exemestane.   Electronically Signed: Azzie Roup 06/01/2019, 10:01 AM     I spent a total of 15 Minutes in remote  clinical consultation, greater than 50% of which was counseling/coordinating care post thermal ablation of a liver metastasis..    Visit type: Audio and video [employer]).   Alternative for in-person consultation at [facility], [address] [employer], [place], [place]. This visit type was conducted due to national  recommendations for restrictions regarding the COVID-19 Pandemic (e.g. social distancing).  This format is felt to be most appropriate for this patient at this time.  All issues noted in this document were discussed and addressed.

## 2019-06-14 ENCOUNTER — Other Ambulatory Visit: Payer: Self-pay | Admitting: Physician Assistant

## 2019-06-14 DIAGNOSIS — F419 Anxiety disorder, unspecified: Secondary | ICD-10-CM

## 2019-07-13 ENCOUNTER — Other Ambulatory Visit: Payer: Self-pay

## 2019-07-13 DIAGNOSIS — F988 Other specified behavioral and emotional disorders with onset usually occurring in childhood and adolescence: Secondary | ICD-10-CM

## 2019-07-13 MED ORDER — AMPHETAMINE-DEXTROAMPHETAMINE 20 MG PO TABS: ORAL_TABLET | ORAL | 0 refills | Status: AC

## 2019-08-09 ENCOUNTER — Other Ambulatory Visit: Payer: Self-pay | Admitting: Internal Medicine

## 2019-08-11 ENCOUNTER — Other Ambulatory Visit: Payer: Self-pay | Admitting: Physician Assistant

## 2019-08-11 DIAGNOSIS — R52 Pain, unspecified: Secondary | ICD-10-CM

## 2019-08-15 ENCOUNTER — Other Ambulatory Visit: Payer: Self-pay | Admitting: Interventional Radiology

## 2019-08-15 DIAGNOSIS — C50919 Malignant neoplasm of unspecified site of unspecified female breast: Secondary | ICD-10-CM

## 2019-08-18 ENCOUNTER — Telehealth: Payer: Self-pay | Admitting: Internal Medicine

## 2019-08-18 NOTE — Telephone Encounter (Signed)
Called to schedule [facility] Palliative visit and family asks for our RN to call them to give more info.

## 2019-08-22 ENCOUNTER — Telehealth: Payer: Self-pay | Admitting: Internal Medicine

## 2019-08-22 NOTE — Telephone Encounter (Signed)
Saw in Epic that patient was admitted to [facility] and she has been admitted to hospice services from there.  I will cancel our Palliative referral and notify Dr. Laurelyn Sickle office.

## 2019-09-04 DEATH — deceased

## 2019-10-21 ENCOUNTER — Encounter: Payer: PRIVATE HEALTH INSURANCE | Admitting: Internal Medicine

## 2019-10-25 ENCOUNTER — Encounter: Payer: Self-pay | Admitting: Internal Medicine

## 2019-10-28 ENCOUNTER — Encounter: Payer: PRIVATE HEALTH INSURANCE | Admitting: Physician Assistant
